# Patient Record
Sex: Female | Born: 1965 | Hispanic: Yes | Marital: Single | Smoking: Never smoker
Health system: Southern US, Community
[De-identification: ages and names within clinical notes are randomized; demographics above are authoritative.]

## PROBLEM LIST (undated history)

## (undated) DIAGNOSIS — G4700 Insomnia, unspecified: Secondary | ICD-10-CM

## (undated) DIAGNOSIS — K219 Gastro-esophageal reflux disease without esophagitis: Secondary | ICD-10-CM

## (undated) DIAGNOSIS — F32A Depression, unspecified: Secondary | ICD-10-CM

## (undated) DIAGNOSIS — F419 Anxiety disorder, unspecified: Secondary | ICD-10-CM

## (undated) DIAGNOSIS — J45909 Unspecified asthma, uncomplicated: Secondary | ICD-10-CM

## (undated) DIAGNOSIS — E785 Hyperlipidemia, unspecified: Secondary | ICD-10-CM

## (undated) DIAGNOSIS — G43909 Migraine, unspecified, not intractable, without status migrainosus: Secondary | ICD-10-CM

## (undated) HISTORY — PX: CYST REMOVAL HAND: SHX6279

---

## 2010-10-24 ENCOUNTER — Inpatient Hospital Stay: Payer: Self-pay | Admitting: Unknown Physician Specialty

## 2011-07-01 ENCOUNTER — Emergency Department: Payer: Self-pay | Admitting: Emergency Medicine

## 2011-09-30 ENCOUNTER — Emergency Department: Payer: Self-pay | Admitting: Emergency Medicine

## 2011-09-30 LAB — CBC WITH DIFFERENTIAL/PLATELET
Basophil #: 0.1 10*3/uL (ref 0.0–0.1)
Basophil %: 1 %
Eosinophil #: 0.1 10*3/uL (ref 0.0–0.7)
Eosinophil %: 1.5 %
HCT: 37.9 % (ref 35.0–47.0)
HGB: 13 g/dL (ref 12.0–16.0)
Lymphocyte #: 3.4 10*3/uL (ref 1.0–3.6)
Lymphocyte %: 54.7 %
MCH: 29 pg (ref 26.0–34.0)
MCHC: 34.4 g/dL (ref 32.0–36.0)
MCV: 84 fL (ref 80–100)
Monocyte #: 0.7 x10 3/mm (ref 0.2–0.9)
Monocyte %: 11.2 %
Neutrophil #: 2 10*3/uL (ref 1.4–6.5)
Neutrophil %: 31.6 %
Platelet: 169 10*3/uL (ref 150–440)
RBC: 4.5 10*6/uL (ref 3.80–5.20)
RDW: 13.9 % (ref 11.5–14.5)
WBC: 6.2 10*3/uL (ref 3.6–11.0)

## 2011-09-30 LAB — COMPREHENSIVE METABOLIC PANEL
Albumin: 3.3 g/dL — ABNORMAL LOW (ref 3.4–5.0)
Alkaline Phosphatase: 88 U/L (ref 50–136)
Anion Gap: 5 — ABNORMAL LOW (ref 7–16)
BUN: 13 mg/dL (ref 7–18)
Bilirubin,Total: 0.4 mg/dL (ref 0.2–1.0)
Calcium, Total: 8.3 mg/dL — ABNORMAL LOW (ref 8.5–10.1)
Chloride: 107 mmol/L (ref 98–107)
Co2: 28 mmol/L (ref 21–32)
Creatinine: 0.84 mg/dL (ref 0.60–1.30)
EGFR (African American): >60
EGFR (Non-African Amer.): >60
Glucose: 95 mg/dL (ref 65–99)
Osmolality: 279 (ref 275–301)
Potassium: 3.8 mmol/L (ref 3.5–5.1)
SGOT(AST): 55 U/L — ABNORMAL HIGH (ref 15–37)
SGPT (ALT): 74 U/L
Sodium: 140 mmol/L (ref 136–145)
Total Protein: 7.2 g/dL (ref 6.4–8.2)

## 2011-09-30 LAB — URINALYSIS, COMPLETE
RBC,UR: NONE SEEN /HPF (ref 0–5)
Squamous Epithelial: 2
WBC UR: 3 /HPF (ref 0–5)

## 2011-09-30 LAB — WET PREP, GENITAL

## 2012-03-17 IMAGING — CT CT HEAD WITHOUT CONTRAST
2 series · 16 of 30 positions shown, 20 images · non-contrast
Comparison: none

REASON FOR EXAM: AMS/OVERDOSE
COMMENTS:

[Series 2: without · axial · non-contrast · 0.40mm/px · z∈[+21,+141]mm · 13 of 30 slices shown, 17 images]
[im 3/30  brain]
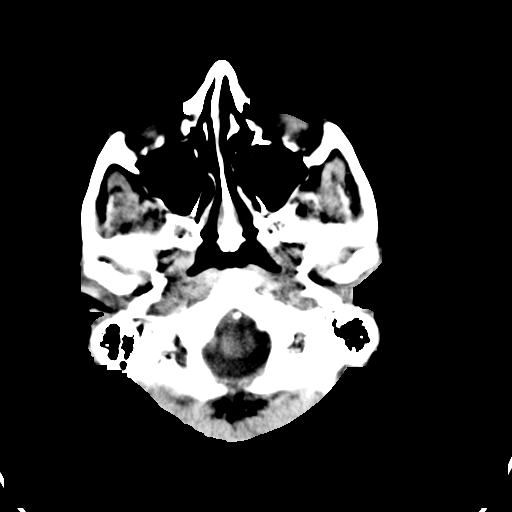
[im 3/30  bone]
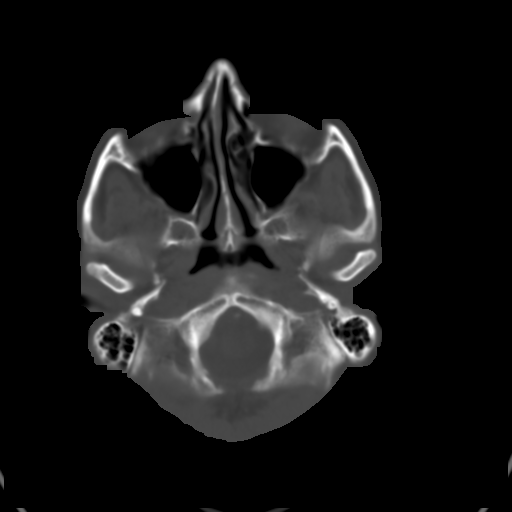
[im 5/30  brain]
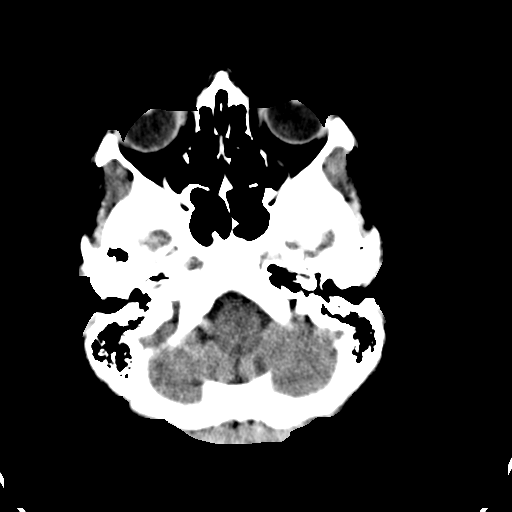
[im 7/30  brain]
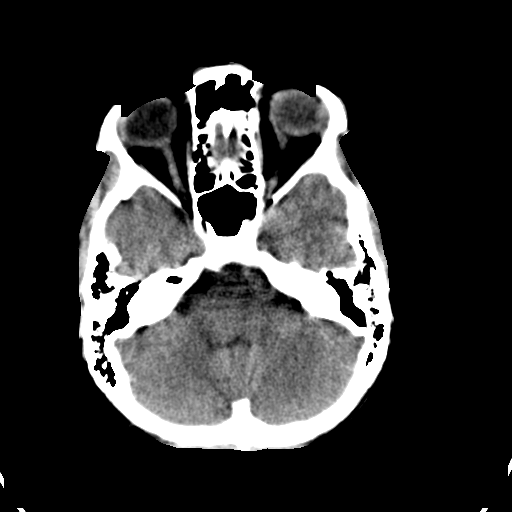
[im 9/30  brain]
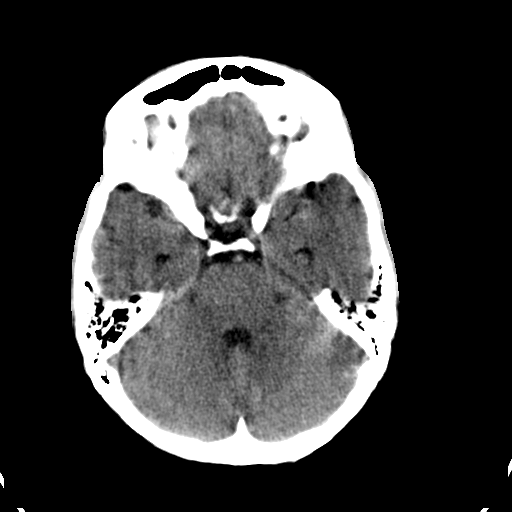
[im 11/30  brain]
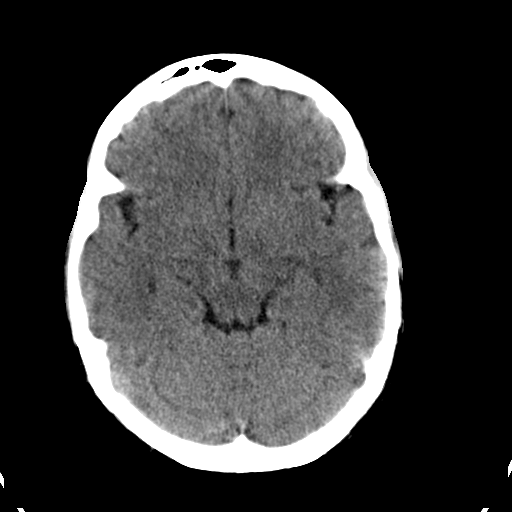
[im 11/30  bone]
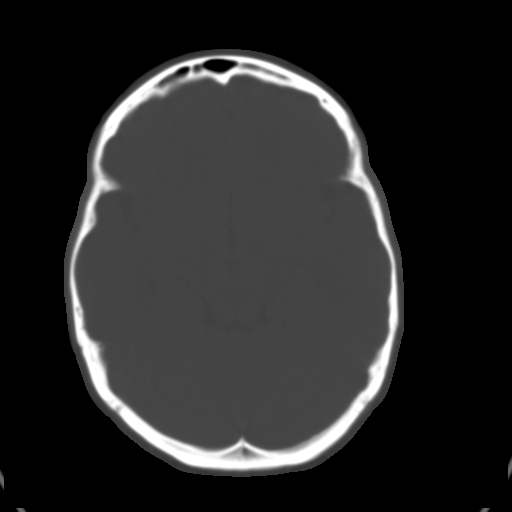
[im 13/30  brain]
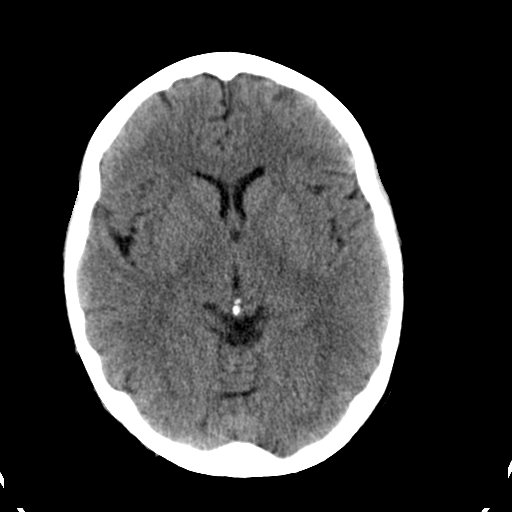
[im 15/30  brain]
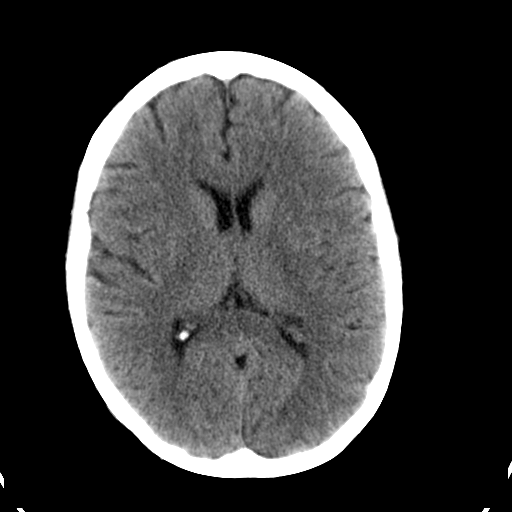
[im 17/30  brain]
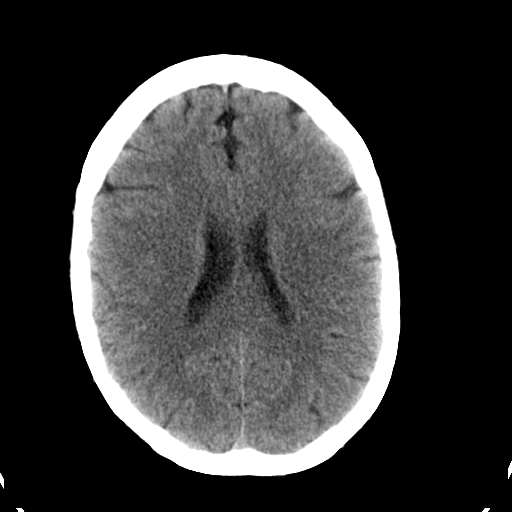
[im 19/30  brain]
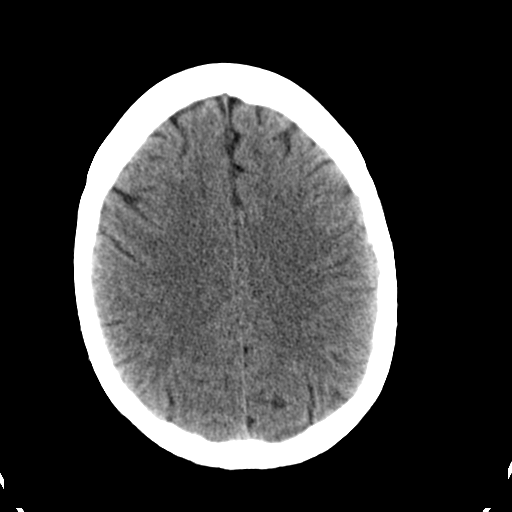
[im 19/30  bone]
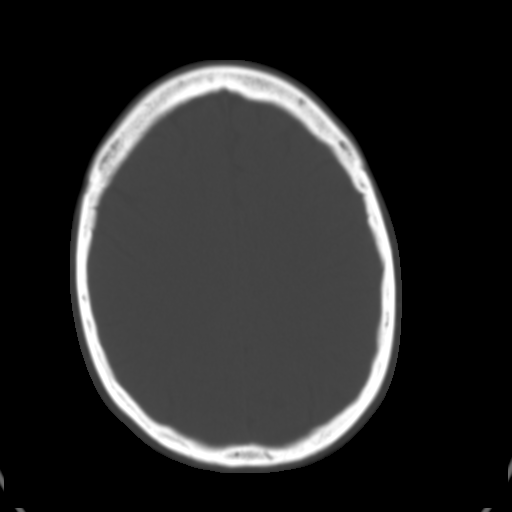
[im 21/30  brain]
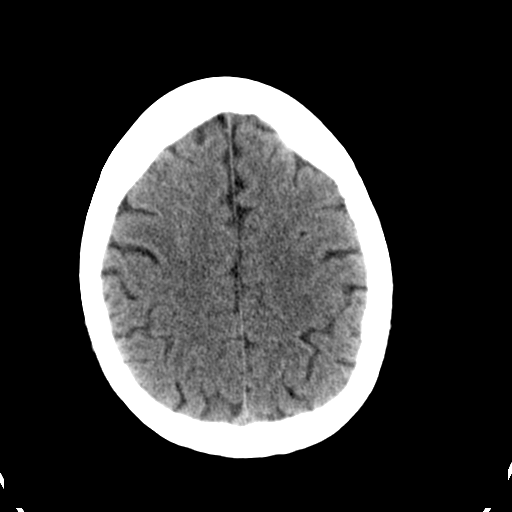
[im 23/30  brain]
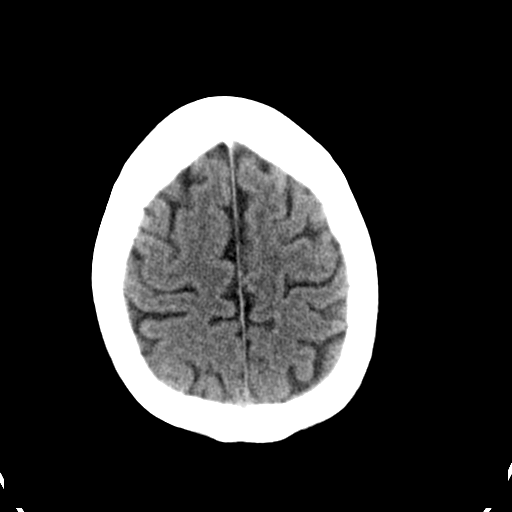
[im 25/30  brain]
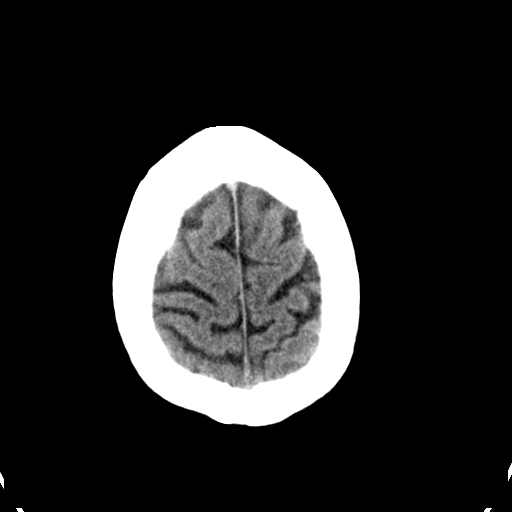
[im 27/30  brain]
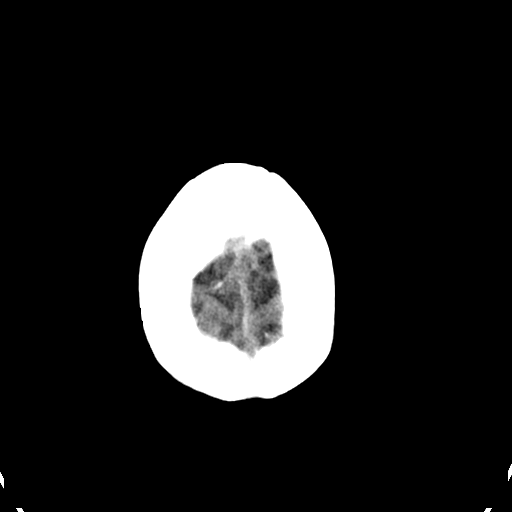
[im 27/30  bone]
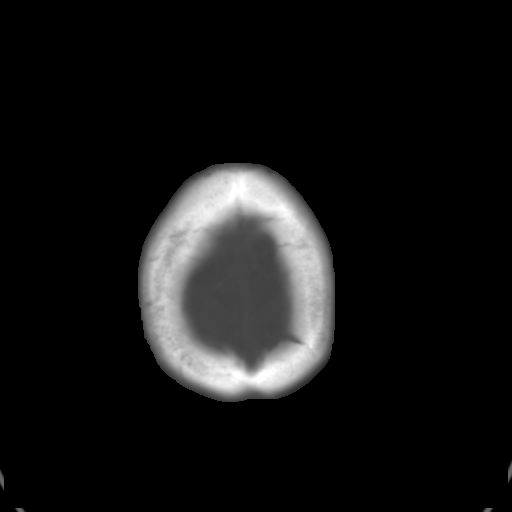

[Series 3: bone · axial · 0.40mm/px · z∈[+21,+61]mm · 3 of 30 slices shown]
[im 3/30  bone]
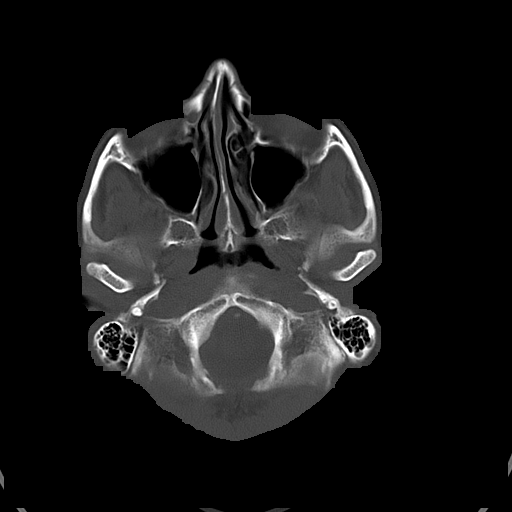
[im 7/30  bone]
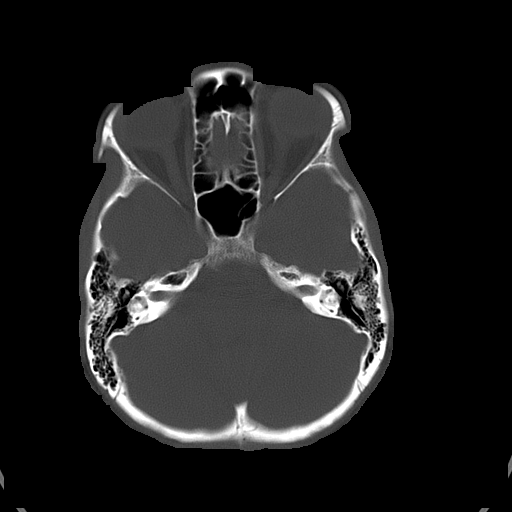
[im 11/30  bone]
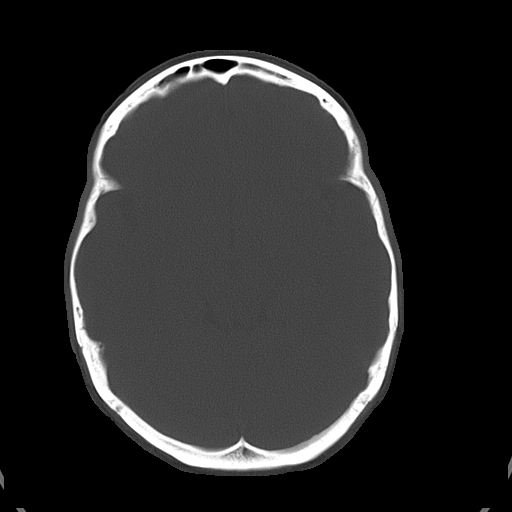

[16 of 30 positions shown; findings below may reference images not displayed]

PROCEDURE:     CT  - CT HEAD WITHOUT CONTRAST  - October 23, 2010 [DATE]

RESULT:     Emergent noncontrast CT of the brain is performed in the
standard fashion. There is no previous exam for comparison.

The ventricles and sulci are normal. There is no hemorrhage. There is no
focal mass, mass-effect or midline shift. There is no evidence of edema or
territorial infarct. The bone windows demonstrate normal aeration of the
paranasal sinuses and mastoid air cells. There is no skull fracture
demonstrated.
IMPRESSION: 1. No acute intracranial abnormality.

## 2012-12-04 ENCOUNTER — Ambulatory Visit: Payer: Self-pay | Admitting: Emergency Medicine

## 2012-12-04 LAB — CBC WITH DIFFERENTIAL/PLATELET
Basophil #: 0 10*3/uL (ref 0.0–0.1)
Basophil %: 0.5 %
Eosinophil #: 0 10*3/uL (ref 0.0–0.7)
Eosinophil %: 0.6 %
HCT: 38.7 % (ref 35.0–47.0)
HGB: 13 g/dL (ref 12.0–16.0)
Lymphocyte #: 1.9 10*3/uL (ref 1.0–3.6)
Lymphocyte %: 28 %
MCH: 27.8 pg (ref 26.0–34.0)
MCHC: 33.7 g/dL (ref 32.0–36.0)
MCV: 83 fL (ref 80–100)
Monocyte #: 0.6 x10 3/mm (ref 0.2–0.9)
Monocyte %: 8.2 %
Neutrophil #: 4.3 10*3/uL (ref 1.4–6.5)
Neutrophil %: 62.7 %
Platelet: 194 10*3/uL (ref 150–440)
RBC: 4.68 10*6/uL (ref 3.80–5.20)
RDW: 13.9 % (ref 11.5–14.5)
WBC: 6.8 10*3/uL (ref 3.6–11.0)

## 2012-12-04 LAB — COMPREHENSIVE METABOLIC PANEL
Albumin: 4.2 g/dL (ref 3.4–5.0)
Alkaline Phosphatase: 85 U/L (ref 50–136)
Anion Gap: 14 (ref 7–16)
BUN: 10 mg/dL (ref 7–18)
Bilirubin,Total: 0.6 mg/dL (ref 0.2–1.0)
Calcium, Total: 9.7 mg/dL (ref 8.5–10.1)
Chloride: 104 mmol/L (ref 98–107)
Co2: 24 mmol/L (ref 21–32)
Creatinine: 0.73 mg/dL (ref 0.60–1.30)
EGFR (African American): >60
EGFR (Non-African Amer.): >60
Glucose: 105 mg/dL — ABNORMAL HIGH (ref 65–99)
Osmolality: 283 (ref 275–301)
Potassium: 3.5 mmol/L (ref 3.5–5.1)
SGOT(AST): 16 U/L (ref 15–37)
SGPT (ALT): 16 U/L (ref 12–78)
Sodium: 142 mmol/L (ref 136–145)
Total Protein: 8.2 g/dL (ref 6.4–8.2)

## 2012-12-04 LAB — AMYLASE: Amylase: 61 U/L (ref 25–115)

## 2012-12-04 LAB — LIPASE, BLOOD: Lipase: 165 U/L (ref 73–393)

## 2012-12-05 ENCOUNTER — Ambulatory Visit: Payer: Self-pay | Admitting: Emergency Medicine

## 2014-04-21 ENCOUNTER — Emergency Department: Payer: Self-pay | Admitting: Emergency Medicine

## 2014-04-21 LAB — BASIC METABOLIC PANEL
Anion Gap: 8 (ref 7–16)
BUN: 8 mg/dL (ref 7–18)
Calcium, Total: 8.9 mg/dL (ref 8.5–10.1)
Chloride: 105 mmol/L (ref 98–107)
Co2: 28 mmol/L (ref 21–32)
Creatinine: 0.76 mg/dL (ref 0.60–1.30)
EGFR (African American): >60
EGFR (Non-African Amer.): >60
Glucose: 112 mg/dL — ABNORMAL HIGH (ref 65–99)
Osmolality: 280 (ref 275–301)
Potassium: 3.6 mmol/L (ref 3.5–5.1)
Sodium: 141 mmol/L (ref 136–145)

## 2014-04-21 LAB — CBC WITH DIFFERENTIAL/PLATELET
Basophil #: 0 10*3/uL (ref 0.0–0.1)
Basophil %: 0.5 %
Eosinophil #: 0.1 10*3/uL (ref 0.0–0.7)
Eosinophil %: 0.7 %
HCT: 39.3 % (ref 35.0–47.0)
HGB: 12.8 g/dL (ref 12.0–16.0)
Lymphocyte #: 1 10*3/uL (ref 1.0–3.6)
Lymphocyte %: 12.4 %
MCH: 27.5 pg (ref 26.0–34.0)
MCHC: 32.6 g/dL (ref 32.0–36.0)
MCV: 84 fL (ref 80–100)
Monocyte #: 0.3 x10 3/mm (ref 0.2–0.9)
Monocyte %: 3.3 %
Neutrophil #: 6.8 10*3/uL — ABNORMAL HIGH (ref 1.4–6.5)
Neutrophil %: 83.1 %
Platelet: 204 10*3/uL (ref 150–440)
RBC: 4.67 10*6/uL (ref 3.80–5.20)
RDW: 13.9 % (ref 11.5–14.5)
WBC: 8.1 10*3/uL (ref 3.6–11.0)

## 2016-04-25 DIAGNOSIS — J45909 Unspecified asthma, uncomplicated: Secondary | ICD-10-CM | POA: Diagnosis present

## 2019-05-23 ENCOUNTER — Encounter: Payer: Self-pay | Admitting: Emergency Medicine

## 2019-05-23 ENCOUNTER — Other Ambulatory Visit: Payer: Self-pay

## 2019-05-23 DIAGNOSIS — Z5321 Procedure and treatment not carried out due to patient leaving prior to being seen by health care provider: Secondary | ICD-10-CM | POA: Diagnosis not present

## 2019-05-23 DIAGNOSIS — R42 Dizziness and giddiness: Secondary | ICD-10-CM | POA: Diagnosis present

## 2019-05-23 DIAGNOSIS — R55 Syncope and collapse: Secondary | ICD-10-CM | POA: Diagnosis not present

## 2019-05-23 LAB — URINALYSIS, COMPLETE (UACMP) WITH MICROSCOPIC
Bacteria, UA: NONE SEEN
Bilirubin Urine: NEGATIVE
Glucose, UA: NEGATIVE mg/dL
Ketones, ur: NEGATIVE mg/dL
Nitrite: NEGATIVE
Protein, ur: NEGATIVE mg/dL
Specific Gravity, Urine: 1.02 (ref 1.005–1.030)
pH: 5 (ref 5.0–8.0)

## 2019-05-23 LAB — BASIC METABOLIC PANEL
Anion gap: 8 (ref 5–15)
BUN: 21 mg/dL — ABNORMAL HIGH (ref 6–20)
CO2: 26 mmol/L (ref 22–32)
Calcium: 9.3 mg/dL (ref 8.9–10.3)
Chloride: 105 mmol/L (ref 98–111)
Creatinine, Ser: 0.85 mg/dL (ref 0.44–1.00)
GFR calc Af Amer: >60 mL/min (ref >60)
GFR calc non Af Amer: >60 mL/min (ref >60)
Glucose, Bld: 100 mg/dL — ABNORMAL HIGH (ref 70–99)
Potassium: 3.9 mmol/L (ref 3.5–5.1)
Sodium: 139 mmol/L (ref 135–145)

## 2019-05-23 LAB — CBC
HCT: 38.3 % (ref 36.0–46.0)
Hemoglobin: 12.5 g/dL (ref 12.0–15.0)
MCH: 27.9 pg (ref 26.0–34.0)
MCHC: 32.6 g/dL (ref 30.0–36.0)
MCV: 85.5 fL (ref 80.0–100.0)
Platelets: 237 10*3/uL (ref 150–400)
RBC: 4.48 MIL/uL (ref 3.87–5.11)
RDW: 12.8 % (ref 11.5–15.5)
WBC: 7.3 10*3/uL (ref 4.0–10.5)
nRBC: 0 % (ref 0.0–0.2)

## 2019-05-23 LAB — TROPONIN I (HIGH SENSITIVITY): Troponin I (High Sensitivity): <2 ng/L (ref <18)

## 2019-05-23 LAB — POCT PREGNANCY, URINE: Preg Test, Ur: NEGATIVE

## 2019-05-23 MED ORDER — SODIUM CHLORIDE 0.9% FLUSH: 3.0000 mL | Freq: Once | INTRAVENOUS | Status: DC

## 2019-05-23 NOTE — ED Triage Notes (Signed)
C/O intermittent dizziness x 4 weeks.  Describes episodes of possible syncope at times.  Describes 3-4 day history of feeling brain is "lagging behind body". Seen by PCP for same complaints, has sleep study scheduled for tomorrow.  AAOx3.  Skin warm and dry. NAD  Speech clear.  MAE equally and strong.  Gait steady.

## 2019-05-24 ENCOUNTER — Emergency Department
Admission: EM | Admit: 2019-05-24 | Discharge: 2019-05-24 | Disposition: A | Discharge status: Home / Self Care | Payer: Managed Care, Other (non HMO) | Attending: Emergency Medicine | Admitting: Emergency Medicine

## 2019-05-24 HISTORY — DX: Insomnia, unspecified: G47.00

## 2019-05-24 HISTORY — DX: Migraine, unspecified, not intractable, without status migrainosus: G43.909

## 2019-05-24 NOTE — ED Notes (Signed)
Pt reports leaving now due to long wait 

## 2020-05-16 DIAGNOSIS — I2542 Coronary artery dissection: Secondary | ICD-10-CM

## 2020-05-16 HISTORY — DX: Coronary artery dissection: I25.42

## 2020-07-13 DIAGNOSIS — E785 Hyperlipidemia, unspecified: Secondary | ICD-10-CM | POA: Diagnosis present

## 2021-11-09 ENCOUNTER — Observation Stay
Admission: EM | Admit: 2021-11-09 | Discharge: 2021-11-11 | Discharge status: Against Medical Advice | Payer: Medicaid Other | Attending: Internal Medicine | Admitting: Internal Medicine

## 2021-11-09 ENCOUNTER — Emergency Department: Payer: Medicaid Other

## 2021-11-09 ENCOUNTER — Other Ambulatory Visit: Payer: Self-pay

## 2021-11-09 DIAGNOSIS — D649 Anemia, unspecified: Secondary | ICD-10-CM | POA: Diagnosis not present

## 2021-11-09 DIAGNOSIS — E785 Hyperlipidemia, unspecified: Secondary | ICD-10-CM | POA: Insufficient documentation

## 2021-11-09 DIAGNOSIS — I25110 Atherosclerotic heart disease of native coronary artery with unstable angina pectoris: Secondary | ICD-10-CM | POA: Insufficient documentation

## 2021-11-09 DIAGNOSIS — R0602 Shortness of breath: Secondary | ICD-10-CM | POA: Insufficient documentation

## 2021-11-09 DIAGNOSIS — R0789 Other chest pain: Principal | ICD-10-CM | POA: Insufficient documentation

## 2021-11-09 DIAGNOSIS — Z79899 Other long term (current) drug therapy: Secondary | ICD-10-CM | POA: Insufficient documentation

## 2021-11-09 DIAGNOSIS — J45909 Unspecified asthma, uncomplicated: Secondary | ICD-10-CM | POA: Diagnosis not present

## 2021-11-09 DIAGNOSIS — R079 Chest pain, unspecified: Secondary | ICD-10-CM | POA: Diagnosis present

## 2021-11-09 DIAGNOSIS — I200 Unstable angina: Secondary | ICD-10-CM | POA: Diagnosis not present

## 2021-11-09 HISTORY — DX: Anxiety disorder, unspecified: F41.9

## 2021-11-09 HISTORY — DX: Depression, unspecified: F32.A

## 2021-11-09 HISTORY — DX: Unspecified asthma, uncomplicated: J45.909

## 2021-11-09 HISTORY — DX: Gastro-esophageal reflux disease without esophagitis: K21.9

## 2021-11-09 HISTORY — DX: Hyperlipidemia, unspecified: E78.5

## 2021-11-09 LAB — APTT: aPTT: 31 seconds (ref 24–36)

## 2021-11-09 LAB — BASIC METABOLIC PANEL
Anion gap: 10 (ref 5–15)
BUN: 19 mg/dL (ref 6–20)
CO2: 21 mmol/L — ABNORMAL LOW (ref 22–32)
Calcium: 9 mg/dL (ref 8.9–10.3)
Chloride: 110 mmol/L (ref 98–111)
Creatinine, Ser: 0.76 mg/dL (ref 0.44–1.00)
GFR, Estimated: >60 mL/min (ref >60)
Glucose, Bld: 104 mg/dL — ABNORMAL HIGH (ref 70–99)
Potassium: 3.6 mmol/L (ref 3.5–5.1)
Sodium: 141 mmol/L (ref 135–145)

## 2021-11-09 LAB — CBC
HCT: 35.1 % — ABNORMAL LOW (ref 36.0–46.0)
Hemoglobin: 11.6 g/dL — ABNORMAL LOW (ref 12.0–15.0)
MCH: 28 pg (ref 26.0–34.0)
MCHC: 33 g/dL (ref 30.0–36.0)
MCV: 84.6 fL (ref 80.0–100.0)
Platelets: 185 10*3/uL (ref 150–400)
RBC: 4.15 MIL/uL (ref 3.87–5.11)
RDW: 13.2 % (ref 11.5–15.5)
WBC: 6.9 10*3/uL (ref 4.0–10.5)
nRBC: 0 % (ref 0.0–0.2)

## 2021-11-09 LAB — BRAIN NATRIURETIC PEPTIDE: B Natriuretic Peptide: 10.3 pg/mL (ref 0.0–100.0)

## 2021-11-09 LAB — PROTIME-INR
INR: 1 (ref 0.8–1.2)
Prothrombin Time: 13 seconds (ref 11.4–15.2)

## 2021-11-09 LAB — TROPONIN I (HIGH SENSITIVITY)
Troponin I (High Sensitivity): 4 ng/L (ref <18)
Troponin I (High Sensitivity): 4 ng/L (ref <18)

## 2021-11-09 LAB — LIPASE, BLOOD: Lipase: 45 U/L (ref 11–51)

## 2021-11-09 LAB — D-DIMER, QUANTITATIVE: D-Dimer, Quant: 0.54 ug/mL-FEU — ABNORMAL HIGH (ref 0.00–0.50)

## 2021-11-09 MED ORDER — ATORVASTATIN CALCIUM 20 MG PO TABS
40.0000 mg | ORAL_TABLET | Freq: Every day | ORAL | Status: DC
  Administered 2021-11-10: 40 mg via ORAL
  Filled 2021-11-09: qty 2

## 2021-11-09 MED ORDER — ACETAMINOPHEN 325 MG PO TABS: 650.0000 mg | ORAL_TABLET | ORAL | Status: DC | PRN

## 2021-11-09 MED ORDER — HEPARIN BOLUS VIA INFUSION
2800.0000 [IU] | Freq: Once | INTRAVENOUS | Status: AC
  Administered 2021-11-09: 2800 [IU] via INTRAVENOUS
  Filled 2021-11-09: qty 2800

## 2021-11-09 MED ORDER — NITROGLYCERIN 0.4 MG SL SUBL: 0.4000 mg | SUBLINGUAL_TABLET | SUBLINGUAL | Status: DC | PRN

## 2021-11-09 MED ORDER — IOHEXOL 350 MG/ML SOLN
75.0000 mL | Freq: Once | INTRAVENOUS | Status: AC | PRN
  Administered 2021-11-09: 75 mL via INTRAVENOUS

## 2021-11-09 MED ORDER — SODIUM CHLORIDE 0.9% FLUSH
3.0000 mL | Freq: Two times a day (BID) | INTRAVENOUS | Status: DC
  Administered 2021-11-10: 3 mL via INTRAVENOUS

## 2021-11-09 MED ORDER — HEPARIN (PORCINE) 25000 UT/250ML-% IV SOLN
600.0000 [IU]/h | INTRAVENOUS | Status: DC
  Administered 2021-11-09: 600 [IU]/h via INTRAVENOUS
  Filled 2021-11-09: qty 250

## 2021-11-09 MED ORDER — ASPIRIN 81 MG PO TBEC
81.0000 mg | DELAYED_RELEASE_TABLET | Freq: Every day | ORAL | Status: DC
  Administered 2021-11-10: 81 mg via ORAL
  Filled 2021-11-09: qty 1

## 2021-11-09 MED ORDER — SODIUM CHLORIDE 0.9% FLUSH: 3.0000 mL | INTRAVENOUS | Status: DC | PRN

## 2021-11-09 MED ORDER — ACETAMINOPHEN 500 MG PO TABS
1000.0000 mg | ORAL_TABLET | Freq: Once | ORAL | Status: AC
  Administered 2021-11-09: 1000 mg via ORAL
  Filled 2021-11-09: qty 2

## 2021-11-09 MED ORDER — ONDANSETRON HCL 4 MG/2ML IJ SOLN: 4.0000 mg | Freq: Four times a day (QID) | INTRAMUSCULAR | Status: DC | PRN

## 2021-11-09 MED ORDER — SODIUM CHLORIDE 0.9 % IV SOLN: 250.0000 mL | INTRAVENOUS | Status: DC | PRN

## 2021-11-09 MED ORDER — SODIUM CHLORIDE 0.9 % IV SOLN: INTRAVENOUS | Status: AC

## 2021-11-09 NOTE — ED Triage Notes (Signed)
Chest pain x 2 days. Pt reports pain became worse, sharp, stabbing in nature. Mid clavicular radiating into right back. Pain is better with rest. NS, 324 ASA, 1 spray Nitro with improvement of pain. Hx of MI last year

## 2021-11-09 NOTE — Progress Notes (Signed)
ANTICOAGULATION CONSULT NOTE   Pharmacy Consult for heparin infusion Indication: ACS/STEMI  Allergies  Allergen Reactions   Cafergot [Ergotamine-Caffeine]     Patient Measurements: Height: 5' (152.4 cm) Weight: 47.6 kg (105 lb) IBW/kg (Calculated) : 45.5 Heparin Dosing Weight: 47.6 kg  Vital Signs: Temp: 98.2 F (36.8 C) (06/27 2025) Temp Source: Oral (06/27 2025) BP: 122/61 (06/27 2027) Pulse Rate: 70 (06/27 2025)  Labs: Recent Labs    11/09/21 2028  HGB 11.6*  HCT 35.1*  PLT 185  CREATININE 0.76  TROPONINIHS 4    Estimated Creatinine Clearance: 55.1 mL/min (by C-G formula based on SCr of 0.76 mg/dL).   Medical History: Past Medical History:  Diagnosis Date   Insomnia    Migraine    Assessment: Pt is 56 yo female w/ hx of NSTEMI 2022, presenting to ED d/t worsening CP & SOB over past 2 days.  Goal of Therapy:  Heparin level 0.3-0.7 units/ml Monitor platelets by anticoagulation protocol: Yes   Plan:  Bolus 2800 units x 1 Start heparin infusion at 600 units/hr Check HL in 6 hr after start of infusion CBC daily while on heparin  Otelia Sergeant, PharmD, [facility] 11/09/2021 11:08 PM

## 2021-11-09 NOTE — H&P (Signed)
History and Physical    Patient: Nicole Simpson YKZ:[id number] DOB: 07-Aug-1965 DOA: 11/09/2021 DOS: the patient was seen and examined on 11/10/2021 PCP: Rondel Oh, NP  Patient coming from: Home  Chief Complaint:  Chief Complaint  Patient presents with   Chest Pain   HPI: Nicole Simpson is a 56 y.o. female with medical history significant of CAD, chest pain and sob and worse today while using her shopping cart at [employer].  Patient describes severe shortness of breath and fatigue and lack of energy and not being able to keep up. Patient otherwise does not report any headaches blurred vision speech or gait issues abdominal pain fevers chills nausea vomiting diarrhea any bladder or bowel complaints otherwise.  Review of Systems: As mentioned in the history of present illness. All other systems reviewed and are negative. Past Medical History:  Diagnosis Date   Insomnia    Migraine    Past Surgical History:  Procedure Laterality Date   CESAREAN SECTION     Social History:  reports that she has never smoked. She has never used smokeless tobacco. She reports that she does not currently use alcohol. She reports that she does not use drugs.  Allergies  Allergen Reactions   Cafergot [Ergotamine-Caffeine]     History reviewed. No pertinent family history.  Prior to Admission medications   Not on File    Physical Exam: Vitals:   11/09/21 2025 11/09/21 2027 11/09/21 2300 11/10/21 0359  BP:  122/61 127/81 130/64  Pulse: 70  68 70  Resp: 12  13 16   Temp: 98.2 F (36.8 C)     TempSrc: Oral     SpO2: 100%  100% 99%  Weight:      Height:       Physical Exam Vitals and nursing note reviewed.  Constitutional:      General: She is not in acute distress.    Appearance: Normal appearance. She is not ill-appearing, toxic-appearing or diaphoretic.  HENT:     Head: Normocephalic and atraumatic.     Right Ear: Hearing and external ear normal.     Left Ear: Hearing  and external ear normal.     Nose: Nose normal. No nasal deformity.     Mouth/Throat:     Lips: Pink.     Mouth: Mucous membranes are moist.     Tongue: No lesions.     Pharynx: Oropharynx is clear.  Eyes:     Extraocular Movements: Extraocular movements intact.     Pupils: Pupils are equal, round, and reactive to light.  Cardiovascular:     Rate and Rhythm: Normal rate and regular rhythm.     Pulses: Normal pulses.     Heart sounds: Normal heart sounds.  Pulmonary:     Effort: Pulmonary effort is normal.     Breath sounds: Examination of the right-middle field reveals rales. Examination of the left-middle field reveals rales. Examination of the right-lower field reveals rales. Examination of the left-lower field reveals rales. Rales present.  Abdominal:     General: Bowel sounds are normal. There is no distension.     Palpations: Abdomen is soft. There is no mass.     Tenderness: There is no abdominal tenderness. There is no guarding.     Hernia: No hernia is present.  Musculoskeletal:     Right lower leg: 2+ Edema present.     Left lower leg: 2+ Edema present.  Skin:    General: Skin is  warm.  Neurological:     General: No focal deficit present.     Mental Status: She is alert and oriented to person, place, and time.     Cranial Nerves: Cranial nerves 2-12 are intact.     Motor: Motor function is intact.  Psychiatric:        Attention and Perception: Attention normal.        Mood and Affect: Mood normal.        Speech: Speech normal.        Behavior: Behavior normal. Behavior is cooperative.        Cognition and Memory: Cognition normal.     Data Reviewed: Results for orders placed or performed during the hospital encounter of 11/09/21 (from the past 24 hour(s))  Basic metabolic panel     Status: Abnormal   Collection Time: 11/09/21  8:28 PM  Result Value Ref Range   Sodium 141 135 - 145 mmol/L   Potassium 3.6 3.5 - 5.1 mmol/L   Chloride 110 98 - 111 mmol/L   CO2 21  (L) 22 - 32 mmol/L   Glucose, Bld 104 (H) 70 - 99 mg/dL   BUN 19 6 - 20 mg/dL   Creatinine, Ser 8.31 0.44 - 1.00 mg/dL   Calcium 9.0 8.9 - 51.7 mg/dL   GFR, Estimated >61 >60 mL/min   Anion gap 10 5 - 15  CBC     Status: Abnormal   Collection Time: 11/09/21  8:28 PM  Result Value Ref Range   WBC 6.9 4.0 - 10.5 K/uL   RBC 4.15 3.87 - 5.11 MIL/uL   Hemoglobin 11.6 (L) 12.0 - 15.0 g/dL   HCT 73.7 (L) 10.6 - 26.9 %   MCV 84.6 80.0 - 100.0 fL   MCH 28.0 26.0 - 34.0 pg   MCHC 33.0 30.0 - 36.0 g/dL   RDW 48.5 46.2 - 70.3 %   Platelets 185 150 - 400 K/uL   nRBC 0.0 0.0 - 0.2 %  Troponin I (High Sensitivity)     Status: None   Collection Time: 11/09/21  8:28 PM  Result Value Ref Range   Troponin I (High Sensitivity) 4 <18 ng/L  D-dimer, quantitative     Status: Abnormal   Collection Time: 11/09/21  8:28 PM  Result Value Ref Range   D-Dimer, Quant 0.54 (H) 0.00 - 0.50 ug/mL-FEU  Lipase, blood     Status: None   Collection Time: 11/09/21  8:28 PM  Result Value Ref Range   Lipase 45 11 - 51 U/L    >EKG today shows:  Sinus rhythm at 72 and LAE>diffuse st depression and lvh.  In ed pt started on heparin drip.       Assessment and Plan: * Chest pain Pt presenting with chest pain , negative troponin. ekg shows SR 72, LAE, LVH,St depression v4-v6. Pt continued on heparin gtt/ asa /statin therapy.  PRN NTG. ekg image:    Unstable angina [facility]) As above cardiology consult dr. Darrold Junker - am message.   SOB (shortness of breath) Suspect right sided heart failure.  We will get 2 d echo and CTA chest neg for pe: 1. Borderline cardiomegaly with mildly prominent central pulmonary veins. No edema or other acute chest findings. 2. No evidence of arterial dilatation, arterial embolic filling defects, or right heart strain findings. 3. There is a 1 cm cyst in the dome of the right hepatic anterior segment and a 1.3 cm indeterminate hypodensity in the posterior segment. Further  evaluation recommended. 4. Additional scattered tiny too small to characterize hypodensities elsewhere in the liver.    Anemia We will follow. No symptoms of any bleeding.    Hyperlipidemia Continue atorvastatin and lipid panel in am. Tft.    Asthma Stable. Prn Albuterol.        Advance Care Planning:    Code Status: Full Code   Consults:  Cardiology; Dr. Darrold Junker.   Family Communication:  [email or web address] (Friend)  [phone] (Mobile  Severity of Illness: The appropriate patient status for this patient is OBSERVATION. Observation status is judged to be reasonable and necessary in order to provide the required intensity of service to ensure the patient's safety. The patient's presenting symptoms, physical exam findings, and initial radiographic and laboratory data in the context of their medical condition is felt to place them at decreased risk for further clinical deterioration. Furthermore, it is anticipated that the patient will be medically stable for discharge from the hospital within 2 midnights of admission.   Author: Gertha Calkin, MD 11/10/2021 4:34 AM  For on call review [email or web address].

## 2021-11-10 ENCOUNTER — Encounter: Payer: Self-pay | Admitting: Internal Medicine

## 2021-11-10 ENCOUNTER — Observation Stay
Admit: 2021-11-10 | Discharge: 2021-11-10 | Disposition: A | Discharge status: Home / Self Care | Payer: Medicaid Other | Attending: Internal Medicine | Admitting: Internal Medicine

## 2021-11-10 DIAGNOSIS — E7849 Other hyperlipidemia: Secondary | ICD-10-CM | POA: Diagnosis not present

## 2021-11-10 DIAGNOSIS — D649 Anemia, unspecified: Secondary | ICD-10-CM | POA: Diagnosis not present

## 2021-11-10 DIAGNOSIS — E7800 Pure hypercholesterolemia, unspecified: Secondary | ICD-10-CM | POA: Diagnosis not present

## 2021-11-10 DIAGNOSIS — R079 Chest pain, unspecified: Secondary | ICD-10-CM | POA: Diagnosis not present

## 2021-11-10 LAB — TROPONIN I (HIGH SENSITIVITY)
Troponin I (High Sensitivity): 3 ng/L (ref <18)
Troponin I (High Sensitivity): 3 ng/L (ref <18)

## 2021-11-10 LAB — ECHOCARDIOGRAM COMPLETE
AR max vel: 1.77 cm2
AV Area VTI: 1.68 cm2
AV Area mean vel: 1.82 cm2
AV Mean grad: 4 mmHg
AV Peak grad: 7.1 mmHg
Ao pk vel: 1.33 m/s
Area-P 1/2: 3.63 cm2
Height: 60 in
P 1/2 time: 1360 msec
S' Lateral: 2.9 cm
Weight: 1680 oz

## 2021-11-10 LAB — CBC
HCT: 35.2 % — ABNORMAL LOW (ref 36.0–46.0)
Hemoglobin: 11.5 g/dL — ABNORMAL LOW (ref 12.0–15.0)
MCH: 27.9 pg (ref 26.0–34.0)
MCHC: 32.7 g/dL (ref 30.0–36.0)
MCV: 85.4 fL (ref 80.0–100.0)
Platelets: 176 10*3/uL (ref 150–400)
RBC: 4.12 MIL/uL (ref 3.87–5.11)
RDW: 13.2 % (ref 11.5–15.5)
WBC: 5.5 10*3/uL (ref 4.0–10.5)
nRBC: 0 % (ref 0.0–0.2)

## 2021-11-10 LAB — LIPID PANEL
Cholesterol: 214 mg/dL — ABNORMAL HIGH (ref 0–200)
HDL: 88 mg/dL (ref >40)
LDL Cholesterol: 115 mg/dL — ABNORMAL HIGH (ref 0–99)
Total CHOL/HDL Ratio: 2.4 RATIO
Triglycerides: 53 mg/dL (ref <150)
VLDL: 11 mg/dL (ref 0–40)

## 2021-11-10 LAB — TSH: TSH: 2.265 u[IU]/mL (ref 0.350–4.500)

## 2021-11-10 LAB — HIV ANTIBODY (ROUTINE TESTING W REFLEX): HIV Screen 4th Generation wRfx: NONREACTIVE

## 2021-11-10 LAB — HEPARIN LEVEL (UNFRACTIONATED): Heparin Unfractionated: 0.47 IU/mL (ref 0.30–0.70)

## 2021-11-10 LAB — T4, FREE: Free T4: 1.02 ng/dL (ref 0.61–1.12)

## 2021-11-10 MED ORDER — ISOSORBIDE MONONITRATE ER 30 MG PO TB24
15.0000 mg | ORAL_TABLET | Freq: Every day | ORAL | Status: DC
  Filled 2021-11-10: qty 1

## 2021-11-10 MED ORDER — RANOLAZINE ER 500 MG PO TB12
500.0000 mg | ORAL_TABLET | Freq: Two times a day (BID) | ORAL | Status: DC
  Administered 2021-11-10: 500 mg via ORAL
  Filled 2021-11-10: qty 1

## 2021-11-10 NOTE — Assessment & Plan Note (Signed)
As above cardiology consult dr. Darrold Junker - am message.

## 2021-11-10 NOTE — Assessment & Plan Note (Addendum)
Pt presenting with chest pain , negative troponin. ekg shows SR 72, LAE, LVH,St depression v4-v6. Pt continued on heparin gtt/ asa /statin therapy.  PRN NTG. ekg image:

## 2021-11-10 NOTE — Assessment & Plan Note (Signed)
Continue atorvastatin and lipid panel in am. Tft.

## 2021-11-10 NOTE — Progress Notes (Signed)
Admission profile updated. ?

## 2021-11-10 NOTE — Consult Note (Signed)
Cardiology Consultation:   Patient ID: Nicole Simpson MRN: [id number]; DOB: [birth year]  Admit date: 11/09/2021 Date of Consult: 11/10/2021  PCP:  Rondel Oh, NP   [facility] Providers Cardiologist:  Debbe Odea, MD        Patient Profile:   Nicole Simpson is a 56 y.o. female with a hx of hyperlipidemia, GERD, depression, asthma, anxiety, migraines, NSTEMI in 2022, and sleep apnea who is being seen 11/10/2021 for the evaluation of chest pain at the request of Dr. Mayford Knife.  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  Previously patient has been followed by Dr. Juliann Pares at [facility] but during this visit she has requested to change providers to [facility] provider.  Her cardiac history started 07/06/2020 where she was admitted to the hospital for worsening chest pain and shortness of breath.  She woke up from sleep with stabbing 10 out of 10 left chest pain rating into the left jaw and left arm associated shortness of breath, palpitations and diaphoresis she underwent left heart catheterization which revealed normal left main, left circumflex, and RCA.  Distal LAD had a short segment with 99% stenosis concern initially for coronary vasospasm with TIMI II flow.  IV nitro nicardipine were given without improvement in stenosis (remains subtotal with now TIMI I flow).  She was started on aspirin, statin, and CCB, as needed sublingual nitro.  She was discharged home with medical management. On yesterday she stated that typically when she does her grocery shopping she rides a motorized scooter.  Yesterday she opted to push a grocery cart and was only able to walk about 30 feet before  having to stop with the shortness of breath.  After several episodes of continuing on with ambulation of 30 feet she started to have a sharp stabbing chest discomfort that was substernal, without radiation, that lasted for few seconds that the patient rated 10 /10 on the pain scale and subsided slightly to about an 8/10.  At that point time she was able to take only a few steps with her cart before she continued to have increasing discomfort and increasing shortness of breath.  She went to the check out by the time she got to the car she had her friend called 911.  When EMS arrived she had Nitropaste placed and had taken 4 baby aspirin's.  She continues to endorse chest discomfort that is rated 3/4 out of 10 that she states is now bearable and continued shortness of breath and dyspnea on exertion.  Prior to this episode she had had chronic abdominal pain associated with intermittent bloody diarrhea and thought that she had a GI virus but had been and able to follow-up with her GI provider prior to coming to the emergency department.  Initial vitals: Blood pressure 122/61, pulse 70, respirations 12, temp 98.2  Pertinent labs: CO2 21, glucose 104, hemoglobin 11.6, hematocrit 35.1, D-dimer 0.54, high-sensitivity troponins 4, 4, 3, 3, total cholesterol 214, LDL 115, BNP 10.3  Medications: Heparin bolus and heparin drip per protocol, and Tylenol at 1000 mg  Imaging: Chest x-ray revealed no active cardiopulmonary disease CTA of the chest borderline cardiomegaly with mildly prominent central pulmonary veins no edema or other acute  chest findings, no evidence of arterial dilatation, arterial emboli on filling defects, or right heart strain findings, there is a 1 mm cyst at the dome of the right hepatic anterior segment and a 1.3 cm indeterminate hypodensity in the posterior segment further evaluation is recommended, additional scattered tiny too small to characterize hypodensities elsewhere in the liver   Past  Medical History:  Diagnosis Date   Anxiety    Asthma    Depression    GERD (gastroesophageal reflux disease)    Hyperlipidemia    Insomnia    Migraine    Spontaneous dissection of coronary artery 2022    Past Surgical History:  Procedure Laterality Date   CESAREAN SECTION       Home Medications:  Prior to Admission medications   Medication Sig Start Date End Date Taking? Authorizing Provider  ASMANEX, 120 METERED DOSES, 220 MCG/ACT inhaler Inhale 2 puffs into the lungs 2 (two) times daily. 11/03/21   [provider]  clonazePAM (KLONOPIN) 1 MG tablet Take 1 mg by mouth at bedtime as needed for anxiety. 09/07/21   [provider]  gabapentin (NEURONTIN) 100 MG capsule Take 100 mg by mouth at bedtime. 09/07/21 09/07/22  [provider]  Galcanezumab-gnlm (EMGALITY) 120 MG/ML SOAJ Inject into the skin. Inject 240 mg into the skin monthly. LOADING DOSE-FIRST MONTH ONLY 10/12/21   [provider]  nitroGLYCERIN (NITROSTAT) 0.4 MG SL tablet Place under the tongue. Place 1 tablet (0.4 mg total) under the tongue as directed for Chest pain May take up to 3 doses. 08/27/21 08/27/22  [provider]  SPIRIVA HANDIHALER 18 MCG inhalation capsule Place 1 capsule into inhaler and inhale daily. 10/21/21   [provider]  Ubrogepant 100 MG TABS Take by mouth. Take 100 mg by mouth as directed (Take 1 tab at migraine onset, ok to repeat after 2 hours. Maximum 200mg  per 24 hr period.) 10/12/21 10/12/22  [provider]    Inpatient Medications: Scheduled Meds:  aspirin EC  81 mg Oral Daily   atorvastatin  40 mg Oral q1800   sodium chloride flush  3 mL Intravenous Q12H   Continuous Infusions:  sodium chloride     sodium chloride     heparin 600 Units/hr (11/09/21 2321)   PRN Meds: sodium chloride, acetaminophen, nitroGLYCERIN, ondansetron (ZOFRAN) IV, sodium chloride flush  Allergies:    Allergies  Allergen Reactions   Cafergot  [Ergotamine-Caffeine]     Social History:   Social History   Socioeconomic History   Marital status: Single    Spouse name: Not on file   Number of children: Not on file   Years of education: Not on file   Highest education level: Not on file  Occupational History   Not on file  Tobacco Use   Smoking status: Never   Smokeless tobacco: Never  Substance and Sexual Activity   Alcohol use: Not Currently   Drug use: Never   Sexual activity: Not on file  Other Topics Concern   Not on file  Social History Narrative   Not on file   Social Determinants of Health   Financial Resource Strain: Not on file  Food Insecurity: Not on file  Transportation Needs: Not on file  Physical Activity: Not on file  Stress: Not on file  Social Connections: Not on file  Intimate Partner Violence: Not on file    Family History:    Family History  Problem Relation Age of Onset   Coronary artery  disease Mother    Coronary artery disease Maternal Grandmother      ROS:  Please see the history of present illness.  Review of Systems  Constitutional:  Positive for malaise/fatigue.  HENT: Negative.    Eyes: Negative.   Respiratory:  Positive for shortness of breath.   Cardiovascular:  Positive for chest pain.  Gastrointestinal:  Positive for abdominal pain and diarrhea.  Genitourinary: Negative.   Musculoskeletal:  Positive for falls.  Skin: Negative.   Neurological:  Positive for weakness and headaches.  Endo/Heme/Allergies: Negative.   Psychiatric/Behavioral: Negative.      All other ROS reviewed and negative.     Physical Exam/Data:   Vitals:   11/10/21 0747 11/10/21 0800 11/10/21 1114 11/10/21 1414  BP: 112/75 109/71 106/66 (!) 109/58  Pulse: 72 73 72 66  Resp: Temp: 97.8 F (36.6 C)     TempSrc: Oral     SpO2: 100% 100% 100% 100%  Weight:      Height:       No intake or output data in the 24 hours ending 11/10/21 1448    11/09/2021    8:24 PM 05/23/2019     6:20 PM  Last 3 Weights  Weight (lbs) 105 lb 110 lb  Weight (kg) 47.628 kg 49.896 kg     Body mass index is 20.51 kg/m.  General:  Well nourished, well developed, in no acute distress HEENT: normal Neck: no JVD Vascular: No carotid bruits; Distal pulses 2+ bilaterally Cardiac:  normal S1, S2; RRR; no murmur  Lungs:  clear to auscultation bilaterally, no wheezing, rhonchi or rales  Abd: soft, nontender, no hepatomegaly  Ext: no edema Musculoskeletal:  No deformities, BUE and BLE strength normal and equal Skin: warm and dry  Neuro:  CNs 2-12 intact, no focal abnormalities noted Psych:  Normal affect   EKG:  The EKG was personally reviewed and demonstrates: Sinus rhythm rate of 73, RSR prime in V1 and V2, LVH with early repolarization Telemetry:  Telemetry was personally reviewed and demonstrates: Sinus rhythm rate of 60 and LVH  Relevant CV Studies: Echocardiogram completed 11/10/2021 1. Left ventricular ejection fraction, by estimation, is 55 to 60%. The left ventricle has normal function. The left ventricle has no regional wall motion abnormalities. Left ventricular diastolic parameters were normal.   2. Right ventricular systolic function is normal. The right ventricular size is normal.   3. The mitral valve is normal in structure. Mild mitral valve regurgitation. No evidence of mitral stenosis.   4. The aortic valve is normal in structure. Aortic valve regurgitation is not visualized. No aortic stenosis is present.   5. The inferior vena cava is normal in size with greater than 50% respiratory variability, suggesting right atrial pressure of 3 mmHg.   Laboratory Data:  High Sensitivity Troponin:   Recent Labs  Lab 11/09/21 2028 11/09/21 2226 11/10/21 0222 11/10/21 0629  TROPONINIHS Chemistry Recent Labs  Lab 11/09/21 2028  NA 141  K 3.6  CL 110  CO2 21*  GLUCOSE 104*  BUN 19  CREATININE 0.76  CALCIUM 9.0  GFRNONAA >60  ANIONGAP 10    No results  for input(s): "PROT", "ALBUMIN", "AST", "ALT", "ALKPHOS", "BILITOT" in the last 168 hours. Lipids  Recent Labs  Lab 11/10/21 0629  CHOL 214*  TRIG 53  HDL 88  LDLCALC 115*  CHOLHDL 2.4    Hematology Recent Labs  Lab 11/09/21 2028  11/10/21 0629  WBC 6.9 5.5  RBC 4.15 4.12  HGB 11.6* 11.5*  HCT 35.1* 35.2*  MCV 84.6 85.4  MCH 28.0 27.9  MCHC 33.0 32.7  RDW 13.2 13.2  PLT 185 176   Thyroid  Recent Labs  Lab 11/09/21 2226  TSH 2.265  FREET4 1.02    BNP Recent Labs  Lab 11/09/21 2028  BNP 10.3    DDimer  Recent Labs  Lab 11/09/21 2028  DDIMER 0.54*     Radiology/Studies:  ECHOCARDIOGRAM COMPLETE  Result Date: 11/10/2021    ECHOCARDIOGRAM REPORT   Patient Name:   Nicole Simpson [facility] Date of Exam: 11/10/2021 Medical Rec #:  [id number]             Height:       60.0 in Accession #:    [id number]            Weight:       105.0 lb Date of Birth:  July 11, 1965             BSA:          1.419 m Patient Age:    56 years              BP:           109/71 mmHg Patient Gender: F                     HR:           62 bpm. Exam Location:  [facility] Procedure: 2D Echo, Color Doppler and Cardiac Doppler Indications:     R07.9 Chest Pain  History:         Patient has no prior history of Echocardiogram examinations.                  CAD; Signs/Symptoms:Chest Pain and Shortness of Breath.  Sonographer:     Humphrey Rolls Referring Phys:  [id number] Eliezer Mccoy PATEL Diagnosing Phys: Marcina Millard MD IMPRESSIONS  1. Left ventricular ejection fraction, by estimation, is 55 to 60%. The left ventricle has normal function. The left ventricle has no regional wall motion abnormalities. Left ventricular diastolic parameters were normal.  2. Right ventricular systolic function is normal. The right ventricular size is normal.  3. The mitral valve is normal in structure. Mild mitral valve regurgitation. No evidence of mitral stenosis.  4. The aortic valve is normal in structure. Aortic valve regurgitation is not  visualized. No aortic stenosis is present.  5. The inferior vena cava is normal in size with greater than 50% respiratory variability, suggesting right atrial pressure of 3 mmHg. FINDINGS  Left Ventricle: Left ventricular ejection fraction, by estimation, is 55 to 60%. The left ventricle has normal function. The left ventricle has no regional wall motion abnormalities. The left ventricular internal cavity size was normal in size. There is  no left ventricular hypertrophy. Left ventricular diastolic parameters were normal. Right Ventricle: The right ventricular size is normal. No increase in right ventricular wall thickness. Right ventricular systolic function is normal. Left Atrium: Left atrial size was normal in size. Right Atrium: Right atrial size was normal in size. Pericardium: There is no evidence of pericardial effusion. Mitral Valve: The mitral valve is normal in structure. Mild mitral valve regurgitation. No evidence of mitral valve stenosis. Tricuspid Valve: The tricuspid valve is normal in structure. Tricuspid valve regurgitation is mild . No evidence of tricuspid stenosis. Aortic Valve: The aortic valve is normal in  structure. Aortic valve regurgitation is not visualized. Aortic regurgitation PHT measures 1360 msec. No aortic stenosis is present. Aortic valve mean gradient measures 4.0 mmHg. Aortic valve peak gradient measures 7.1 mmHg. Aortic valve area, by VTI measures 1.68 cm. Pulmonic Valve: The pulmonic valve was normal in structure. Pulmonic valve regurgitation is not visualized. No evidence of pulmonic stenosis. Aorta: The aortic root is normal in size and structure. Venous: The inferior vena cava is normal in size with greater than 50% respiratory variability, suggesting right atrial pressure of 3 mmHg. IAS/Shunts: No atrial level shunt detected by color flow Doppler.  LEFT VENTRICLE PLAX 2D LVIDd:         4.01 cm   Diastology LVIDs:         2.90 cm   LV e' medial:    7.51 cm/s LV PW:          0.88 cm   LV E/e' medial:  10.6 LV IVS:        0.44 cm   LV e' lateral:   11.90 cm/s LVOT diam:     1.90 cm   LV E/e' lateral: 6.7 LV SV:         50 LV SV Index:   35 LVOT Area:     2.84 cm  RIGHT VENTRICLE RV Basal diam:  2.46 cm RV S prime:     11.20 cm/s TAPSE (M-mode): 2.2 cm LEFT ATRIUM             Index        RIGHT ATRIUM           Index LA diam:        2.70 cm 1.90 cm/m   RA Area:     12.40 cm LA Vol (A2C):   46.4 ml 32.69 ml/m  RA Volume:   28.10 ml  19.80 ml/m LA Vol (A4C):   30.1 ml 21.21 ml/m LA Biplane Vol: 39.7 ml 27.97 ml/m  AORTIC VALVE                    PULMONIC VALVE AV Area (Vmax):    1.77 cm     PV Vmax:       0.73 m/s AV Area (Vmean):   1.82 cm     PV Peak grad:  2.1 mmHg AV Area (VTI):     1.68 cm AV Vmax:           133.00 cm/s AV Vmean:          92.900 cm/s AV VTI:            0.296 m AV Peak Grad:      7.1 mmHg AV Mean Grad:      4.0 mmHg LVOT Vmax:         82.90 cm/s LVOT Vmean:        59.700 cm/s LVOT VTI:          0.175 m LVOT/AV VTI ratio: 0.59 AI PHT:            1360 msec  AORTA Ao Root diam: 3.00 cm MITRAL VALVE MV Area (PHT): 3.63 cm    SHUNTS MV Decel Time: 209 msec    Systemic VTI:  0.18 m MV E velocity: 79.30 cm/s  Systemic Diam: 1.90 cm MV A velocity: 55.30 cm/s MV E/A ratio:  1.43 Marcina MillardAlexander Paraschos MD Electronically signed by Marcina MillardAlexander Paraschos MD Signature Date/Time: 11/10/2021/12:21:37 PM    Final    CT Angio Chest PE  W and/or Wo Contrast  Result Date: 11/09/2021 CLINICAL DATA:  Chest pain for 2 days radiating into the right side of the back. Pulmonary embolism suspected. High probability. EXAM: CT ANGIOGRAPHY CHEST WITH CONTRAST TECHNIQUE: Multidetector CT imaging of the chest was performed using the standard protocol during bolus administration of intravenous contrast. Multiplanar CT image reconstructions and MIPs were obtained to evaluate the vascular anatomy. RADIATION DOSE REDUCTION: This exam was performed according to the departmental dose-optimization  program which includes automated exposure control, adjustment of the mA and/or kV according to patient size and/or use of iterative reconstruction technique. CONTRAST:  29mL OMNIPAQUE IOHEXOL 350 MG/ML SOLN COMPARISON:  PA and lateral chest today is the only relevant prior. FINDINGS: Cardiovascular: The cardiac size is upper limits of normal. There is no pericardial effusion. There are no visible coronary artery calcifications. No arterial dilatation or embolus is seen. The aorta is normal in course and caliber without visible plaques, aneurysm or dissection. No great vessel stenosis or plaques are seen. There are mildly distended central pulmonary veins. Mediastinum/Nodes: No enlarged mediastinal, hilar, or axillary lymph nodes. Thyroid gland, trachea, and esophagus demonstrate no significant findings. There is incidentally noted mild elevation of the right hemidiaphragm. Lungs/Pleura: There is mild coarse reticulated scarring at the extreme lung apices. No infiltrate or nodule is seen. There is mild posterior atelectasis in the lower lobes. Central airways are patent. Upper Abdomen: No acute abnormality. There is a 1.3 cm hypodensity in the posterior segment of the right lobe of the liver measuring 33 Hounsfield units above the density of fluid therefore indeterminate. Nonemergent liver dedicated imaging with MRI or CT recommended. In the dome of the anterior segment of the right lobe there is a 1 cm cyst of 21 Hounsfield units. There are additional scattered tiny hypodensities in the liver substance which are too small to characterize. Musculoskeletal: There is mild-to-moderate broad-based thoracic dextroscoliosis. There are degenerative changes in the lower thoracic spine. Review of the MIP images confirms the above findings. IMPRESSION: 1. Borderline cardiomegaly with mildly prominent central pulmonary veins. No edema or other acute chest findings. 2. No evidence of arterial dilatation, arterial embolic filling  defects, or right heart strain findings. 3. There is a 1 cm cyst in the dome of the right hepatic anterior segment and a 1.3 cm indeterminate hypodensity in the posterior segment. Further evaluation recommended. 4. Additional scattered tiny too small to characterize hypodensities elsewhere in the liver. Electronically Signed   By: Almira Bar M.D.   On: 11/09/2021 22:49   DG Chest 2 View  Result Date: 11/09/2021 CLINICAL DATA:  CP EXAM: CHEST - 2 VIEW COMPARISON:  None Available. FINDINGS: The heart and mediastinal contours are within normal limits. No focal consolidation. No pulmonary edema. No pleural effusion. No pneumothorax. No acute osseous abnormality.  Midthoracic dextrocurvature. IMPRESSION: No active cardiopulmonary disease. Electronically Signed   By: Tish Frederickson M.D.   On: 11/09/2021 21:14     Assessment and Plan:   Chest pain with history of SCAD -Hs troponins negative x 4 -echocardiogram completed with no wall motion abnormalities -continue heparin drip for 48 hours -start on imdur 15 mg daily first dose now  -Myoview in AM for continued chest pain with known LAD disease/SCAD -NPO after midnight for myoview in AM -continue asa -continue cardiac monitor -patient previously had to have metoprolol and amlodipine stopped due to soft blood pressures and orthostatic  2.Shortness of breath -CTA chest negative for PE -BNP 10.3 -echocardiogram revealed LVEF 55-60% -currently  on room air -continuous pulse ox  3. Hyperlipidemia -LDL 115, not at goal -continue atorvastatin   4.Normocytic anemia -hgb 11.5 -no active bleeding -daily cbc  5. Asthma -stable -continue rescue inhaler as needed  Risk Assessment/Risk Scores:     TIMI Risk Score for Unstable Angina or Non-ST Elevation MI:   The patient's TIMI risk score is  , which indicates a  % risk of all cause mortality, new or recurrent myocardial infarction or need for urgent revascularization in the next 14 days.           For questions or updates, please contact [facility] Please consult [email or web address] for contact info under    Signed, Shephanie Romas, NP  11/10/2021 2:48 PM

## 2021-11-10 NOTE — TOC CM/SW Note (Signed)
CSW acknowledges consult for home health/DME needs. Please consult PT and OT so they can evaluate for needs.  Charlynn Court, CSW [phone]

## 2021-11-10 NOTE — Assessment & Plan Note (Addendum)
Suspect right sided heart failure.  We will get 2 d echo and CTA chest neg for pe: 1. Borderline cardiomegaly with mildly prominent central pulmonary veins. No edema or other acute chest findings. 2. No evidence of arterial dilatation, arterial embolic filling defects, or right heart strain findings. 3. There is a 1 cm cyst in the dome of the right hepatic anterior segment and a 1.3 cm indeterminate hypodensity in the posterior segment. Further evaluation recommended. 4. Additional scattered tiny too small to characterize hypodensities elsewhere in the liver.

## 2021-11-10 NOTE — Progress Notes (Signed)
*  PRELIMINARY RESULTS* Echocardiogram 2D Echocardiogram has been performed.  Nicole Simpson Nicole Simpson 11/10/2021, 11:45 AM

## 2021-11-10 NOTE — Progress Notes (Signed)
ANTICOAGULATION CONSULT NOTE   Pharmacy Consult for heparin infusion Indication: ACS/STEMI  Allergies  Allergen Reactions   Cafergot [Ergotamine-Caffeine]     Patient Measurements: Height: 5' (152.4 cm) Weight: 47.6 kg (105 lb) IBW/kg (Calculated) : 45.5 Heparin Dosing Weight: 47.6 kg  Vital Signs: Temp: 97.8 F (36.6 C) (06/28 0747) Temp Source: Oral (06/28 0747) BP: 109/71 (06/28 0800) Pulse Rate: 73 (06/28 0800)  Labs: Recent Labs    11/09/21 2028 11/09/21 2226 11/10/21 0222 11/10/21 0629  HGB 11.6*  --   --  11.5*  HCT 35.1*  --   --  35.2*  PLT 185  --   --  176  APTT 31  --   --   --   LABPROT 13.0  --   --   --   INR 1.0  --   --   --   HEPARINUNFRC  --   --   --  0.47  CREATININE 0.76  --   --   --   TROPONINIHS 4 4 3 3      Estimated Creatinine Clearance: 55.1 mL/min (by C-G formula based on SCr of 0.76 mg/dL).   Medical History: Past Medical History:  Diagnosis Date   Insomnia    Migraine    Assessment: Pt is 56 yo female w/ hx of NSTEMI 2022, presenting to ED d/t worsening CP & SOB over past 2 days.  HL 6/28 0630 was 0.4, within range of therapy.  Goal of Therapy:  Heparin level 0.3-0.7 units/ml Monitor platelets by anticoagulation protocol: Yes   Plan:  No change Continue heparin infusion at 600 units/hr Check HL at 1230 CBC daily while on heparin  7/28, PharmD Student 11/10/2021 9:35 AM

## 2021-11-10 NOTE — Assessment & Plan Note (Signed)
We will follow. No symptoms of any bleeding.

## 2021-11-10 NOTE — Assessment & Plan Note (Signed)
Stable. Prn Albuterol.

## 2021-11-10 NOTE — Progress Notes (Signed)
PROGRESS NOTE    Nicole Simpson  UUV:[id number] DOB: 05-05-1966 DOA: 11/09/2021 PCP: Rondel Oh, NP   Assessment & Plan:   Principal Problem:   Chest pain Active Problems:   Unstable angina (HCC)   SOB (shortness of breath)   Asthma   Hyperlipidemia   Anemia  Assessment and Plan: Chest pain: w/ shortness of breath. Unlikely ACS. Troponins neg x 3. Pt is requesting to see [facility] instead of [facility]. Hx of CAD. Cardio consulted ([facility])  HLD: continue on statin  Normocytic anemia: H&H are stable. No need for a transfusion currently   Asthma: unknown stage and/or severity. Continue on bronchodilators   DVT prophylaxis: lovenox  Code Status: full  Family Communication:  Disposition Plan: likely d/c back home   Level of care: Progressive Status is: Observation The patient remains OBS appropriate and will d/c before 2 midnights.    Consultants:  Cardio   Procedures:  Antimicrobials:    Subjective: Pt c/o chest pain   Objective: Vitals:   11/10/21 0359 11/10/21 0700 11/10/21 0747 11/10/21 0800  BP: 130/64  112/75 109/71  Pulse: 70 78 72 73  Resp: 16 18 15 15   Temp:   97.8 F (36.6 C)   TempSrc:   Oral   SpO2: 99% 99% 100% 100%  Weight:      Height:       No intake or output data in the 24 hours ending 11/10/21 0914 Filed Weights   11/09/21 2024  Weight: 47.6 kg    Examination:  General exam: Appears calm and comfortable  Respiratory system: Clear to auscultation. Respiratory effort normal. Cardiovascular system: S1 & S2 +. No rubs, gallops or clicks.  Gastrointestinal system: Abdomen is nondistended, soft and nontender. Normal bowel sounds heard. Central nervous system: Alert and oriented. Moves all extremities  Psychiatry: Judgement and insight appear normal. Mood & affect appropriate.     Data Reviewed: I have personally reviewed following labs and imaging studies  CBC: Recent Labs  Lab 11/09/21 2028 11/10/21 0629   WBC 6.9 5.5  HGB 11.6* 11.5*  HCT 35.1* 35.2*  MCV 84.6 85.4  PLT 185 176   Basic Metabolic Panel: Recent Labs  Lab 11/09/21 2028  NA 141  K 3.6  CL 110  CO2 21*  GLUCOSE 104*  BUN 19  CREATININE 0.76  CALCIUM 9.0   GFR: Estimated Creatinine Clearance: 55.1 mL/min (by C-G formula based on SCr of 0.76 mg/dL). Liver Function Tests: No results for input(s): "AST", "ALT", "ALKPHOS", "BILITOT", "PROT", "ALBUMIN" in the last 168 hours. Recent Labs  Lab 11/09/21 2028  LIPASE 45   No results for input(s): "AMMONIA" in the last 168 hours. Coagulation Profile: Recent Labs  Lab 11/09/21 2028  INR 1.0   Cardiac Enzymes: No results for input(s): "CKTOTAL", "CKMB", "CKMBINDEX", "TROPONINI" in the last 168 hours. BNP (last 3 results) No results for input(s): "PROBNP" in the last 8760 hours. HbA1C: No results for input(s): "HGBA1C" in the last 72 hours. CBG: No results for input(s): "GLUCAP" in the last 168 hours. Lipid Profile: Recent Labs    11/10/21 0629  CHOL 214*  HDL 88  LDLCALC 115*  TRIG 53  CHOLHDL 2.4   Thyroid Function Tests: Recent Labs    11/09/21 2226  TSH 2.265  FREET4 1.02   Anemia Panel: No results for input(s): "VITAMINB12", "FOLATE", "FERRITIN", "TIBC", "IRON", "RETICCTPCT" in the last 72 hours. Sepsis Labs: No results for input(s): "PROCALCITON", "LATICACIDVEN" in the last 168  hours.  No results found for this or any previous visit (from the past 240 hour(s)).       Radiology Studies: CT Angio Chest PE W and/or Wo Contrast  Result Date: 11/09/2021 CLINICAL DATA:  Chest pain for 2 days radiating into the right side of the back. Pulmonary embolism suspected. High probability. EXAM: CT ANGIOGRAPHY CHEST WITH CONTRAST TECHNIQUE: Multidetector CT imaging of the chest was performed using the standard protocol during bolus administration of intravenous contrast. Multiplanar CT image reconstructions and MIPs were obtained to evaluate the  vascular anatomy. RADIATION DOSE REDUCTION: This exam was performed according to the departmental dose-optimization program which includes automated exposure control, adjustment of the mA and/or kV according to patient size and/or use of iterative reconstruction technique. CONTRAST:  58mL OMNIPAQUE IOHEXOL 350 MG/ML SOLN COMPARISON:  PA and lateral chest today is the only relevant prior. FINDINGS: Cardiovascular: The cardiac size is upper limits of normal. There is no pericardial effusion. There are no visible coronary artery calcifications. No arterial dilatation or embolus is seen. The aorta is normal in course and caliber without visible plaques, aneurysm or dissection. No great vessel stenosis or plaques are seen. There are mildly distended central pulmonary veins. Mediastinum/Nodes: No enlarged mediastinal, hilar, or axillary lymph nodes. Thyroid gland, trachea, and esophagus demonstrate no significant findings. There is incidentally noted mild elevation of the right hemidiaphragm. Lungs/Pleura: There is mild coarse reticulated scarring at the extreme lung apices. No infiltrate or nodule is seen. There is mild posterior atelectasis in the lower lobes. Central airways are patent. Upper Abdomen: No acute abnormality. There is a 1.3 cm hypodensity in the posterior segment of the right lobe of the liver measuring 33 Hounsfield units above the density of fluid therefore indeterminate. Nonemergent liver dedicated imaging with MRI or CT recommended. In the dome of the anterior segment of the right lobe there is a 1 cm cyst of 21 Hounsfield units. There are additional scattered tiny hypodensities in the liver substance which are too small to characterize. Musculoskeletal: There is mild-to-moderate broad-based thoracic dextroscoliosis. There are degenerative changes in the lower thoracic spine. Review of the MIP images confirms the above findings. IMPRESSION: 1. Borderline cardiomegaly with mildly prominent central  pulmonary veins. No edema or other acute chest findings. 2. No evidence of arterial dilatation, arterial embolic filling defects, or right heart strain findings. 3. There is a 1 cm cyst in the dome of the right hepatic anterior segment and a 1.3 cm indeterminate hypodensity in the posterior segment. Further evaluation recommended. 4. Additional scattered tiny too small to characterize hypodensities elsewhere in the liver. Electronically Signed   By: Almira Bar M.D.   On: 11/09/2021 22:49   DG Chest 2 View  Result Date: 11/09/2021 CLINICAL DATA:  CP EXAM: CHEST - 2 VIEW COMPARISON:  None Available. FINDINGS: The heart and mediastinal contours are within normal limits. No focal consolidation. No pulmonary edema. No pleural effusion. No pneumothorax. No acute osseous abnormality.  Midthoracic dextrocurvature. IMPRESSION: No active cardiopulmonary disease. Electronically Signed   By: Tish Frederickson M.D.   On: 11/09/2021 21:14        Scheduled Meds:  aspirin EC  81 mg Oral Daily   atorvastatin  40 mg Oral q1800   sodium chloride flush  3 mL Intravenous Q12H   Continuous Infusions:  sodium chloride     sodium chloride     heparin 600 Units/hr (11/09/21 2321)     LOS: 0 days    Time spent: 73  mins     Charise Killian, MD [facility] Pager [phone]  If 7PM-7AM, please contact night-coverage 11/10/2021, 9:14 AM

## 2021-11-11 DIAGNOSIS — D649 Anemia, unspecified: Secondary | ICD-10-CM | POA: Diagnosis not present

## 2021-11-11 DIAGNOSIS — E7849 Other hyperlipidemia: Secondary | ICD-10-CM | POA: Diagnosis not present

## 2021-11-11 DIAGNOSIS — R079 Chest pain, unspecified: Secondary | ICD-10-CM | POA: Diagnosis not present

## 2021-11-11 LAB — BASIC METABOLIC PANEL
Anion gap: 3 — ABNORMAL LOW (ref 5–15)
BUN: 12 mg/dL (ref 6–20)
CO2: 29 mmol/L (ref 22–32)
Calcium: 9.6 mg/dL (ref 8.9–10.3)
Chloride: 108 mmol/L (ref 98–111)
Creatinine, Ser: 0.78 mg/dL (ref 0.44–1.00)
GFR, Estimated: >60 mL/min (ref >60)
Glucose, Bld: 107 mg/dL — ABNORMAL HIGH (ref 70–99)
Potassium: 4.3 mmol/L (ref 3.5–5.1)
Sodium: 140 mmol/L (ref 135–145)

## 2021-11-11 LAB — CBC
HCT: 38.6 % (ref 36.0–46.0)
Hemoglobin: 12.4 g/dL (ref 12.0–15.0)
MCH: 27.5 pg (ref 26.0–34.0)
MCHC: 32.1 g/dL (ref 30.0–36.0)
MCV: 85.6 fL (ref 80.0–100.0)
Platelets: 205 10*3/uL (ref 150–400)
RBC: 4.51 MIL/uL (ref 3.87–5.11)
RDW: 13.2 % (ref 11.5–15.5)
WBC: 5.6 10*3/uL (ref 4.0–10.5)
nRBC: 0 % (ref 0.0–0.2)

## 2021-11-11 LAB — LIPOPROTEIN A (LPA): Lipoprotein (a): 177 nmol/L — ABNORMAL HIGH (ref <75.0)

## 2021-11-11 NOTE — Progress Notes (Signed)
Patient is stating that she does not want to proceed with the Myoview and is asking if her NPO order can be changed. States that her ride will be at the hospital between 1000-1100. Patient removed telemetry monitor. Patient currently eating snack from home. Bishop Limbo, NP notified.

## 2021-11-11 NOTE — Discharge Summary (Signed)
Physician Discharge Summary  LINCY BELLES Feliciano GUR:[id number] DOB: Dec 17, 1965 DOA: 11/09/2021  PCP: Rondel Oh, NP  Admit date: 11/09/2021 Discharge date: 11/11/2021  Admitted From: home  Disposition:  Pt left AMA  Recommendations for Outpatient Follow-up:  Pt left AMA   Home Health: no  Equipment/Devices:  Discharge Condition: stable CODE STATUS: full  Diet recommendation: Heart Healthy  HPI was taken from Dr. Irena Cords: Nicole Simpson is a 55 y.o. female with medical history significant of CAD, chest pain and sob and worse today while using her shopping cart at [employer].  Patient describes severe shortness of breath and fatigue and lack of energy and not being able to keep up. Patient otherwise does not report any headaches blurred vision speech or gait issues abdominal pain fevers chills nausea vomiting diarrhea any bladder or bowel complaints otherwise.   As per Dr. Mayford Knife 6/28-6/29/23: Pt presented w/ atypical chest pain of unknown etiology. Pt requested to see [facility] cardio instead of her regular [facility] cardio as she was not satisfied with their care. Troponins were neg x 3. Echo showed normal systolic & diastolic function & CT chest showed no edema. Pt was schedule for cardiac stress test on 11/11/21 but pt refused and signed out AMA.    Discharge Diagnoses:  Principal Problem:   Chest pain Active Problems:   Unstable angina (HCC)   SOB (shortness of breath)   Asthma   Hyperlipidemia   Anemia  Chest pain: w/ shortness of breath. Unlikely ACS. Troponins neg x 3. Pt is requesting to see [facility] instead of [facility]. Hx of CAD. Cardiac stress test today but now pt is refusing and signed out AMA   HLD: continue on statin  Normocytic anemia: H&H are stable. No need for a transfusion currently   Asthma: unknown stage and/or severity. Continue on bronchodilators   Discharge Instructions     Allergies  Allergen Reactions   Cafergot  [Ergotamine-Caffeine]     Consultations: Cardio    Procedures/Studies: ECHOCARDIOGRAM COMPLETE  Result Date: 11/10/2021    ECHOCARDIOGRAM REPORT   Patient Name:   Nicole Simpson [facility] Date of Exam: 11/10/2021 Medical Rec #:  [id number]             Height:       60.0 in Accession #:    [id number]            Weight:       105.0 lb Date of Birth:  [birth year]             BSA:          1.419 m Patient Age:    56 years              BP:           109/71 mmHg Patient Gender: F                     HR:           62 bpm. Exam Location:  [facility] Procedure: 2D Echo, Color Doppler and Cardiac Doppler Indications:     R07.9 Chest Pain  History:         Patient has no prior history of Echocardiogram examinations.                  CAD; Signs/Symptoms:Chest Pain and Shortness of Breath.  Sonographer:     Humphrey Rolls Referring Phys:  [id number] EKTA V PATEL Diagnosing Phys:  Isaias Cowman MD IMPRESSIONS  1. Left ventricular ejection fraction, by estimation, is 55 to 60%. The left ventricle has normal function. The left ventricle has no regional wall motion abnormalities. Left ventricular diastolic parameters were normal.  2. Right ventricular systolic function is normal. The right ventricular size is normal.  3. The mitral valve is normal in structure. Mild mitral valve regurgitation. No evidence of mitral stenosis.  4. The aortic valve is normal in structure. Aortic valve regurgitation is not visualized. No aortic stenosis is present.  5. The inferior vena cava is normal in size with greater than 50% respiratory variability, suggesting right atrial pressure of 3 mmHg. FINDINGS  Left Ventricle: Left ventricular ejection fraction, by estimation, is 55 to 60%. The left ventricle has normal function. The left ventricle has no regional wall motion abnormalities. The left ventricular internal cavity size was normal in size. There is  no left ventricular hypertrophy. Left ventricular diastolic parameters were normal. Right Ventricle:  The right ventricular size is normal. No increase in right ventricular wall thickness. Right ventricular systolic function is normal. Left Atrium: Left atrial size was normal in size. Right Atrium: Right atrial size was normal in size. Pericardium: There is no evidence of pericardial effusion. Mitral Valve: The mitral valve is normal in structure. Mild mitral valve regurgitation. No evidence of mitral valve stenosis. Tricuspid Valve: The tricuspid valve is normal in structure. Tricuspid valve regurgitation is mild . No evidence of tricuspid stenosis. Aortic Valve: The aortic valve is normal in structure. Aortic valve regurgitation is not visualized. Aortic regurgitation PHT measures 1360 msec. No aortic stenosis is present. Aortic valve mean gradient measures 4.0 mmHg. Aortic valve peak gradient measures 7.1 mmHg. Aortic valve area, by VTI measures 1.68 cm. Pulmonic Valve: The pulmonic valve was normal in structure. Pulmonic valve regurgitation is not visualized. No evidence of pulmonic stenosis. Aorta: The aortic root is normal in size and structure. Venous: The inferior vena cava is normal in size with greater than 50% respiratory variability, suggesting right atrial pressure of 3 mmHg. IAS/Shunts: No atrial level shunt detected by color flow Doppler.  LEFT VENTRICLE PLAX 2D LVIDd:         4.01 cm   Diastology LVIDs:         2.90 cm   LV e' medial:    7.51 cm/s LV PW:         0.88 cm   LV E/e' medial:  10.6 LV IVS:        0.44 cm   LV e' lateral:   11.90 cm/s LVOT diam:     1.90 cm   LV E/e' lateral: 6.7 LV SV:         50 LV SV Index:   35 LVOT Area:     2.84 cm  RIGHT VENTRICLE RV Basal diam:  2.46 cm RV S prime:     11.20 cm/s TAPSE (M-mode): 2.2 cm LEFT ATRIUM             Index        RIGHT ATRIUM           Index LA diam:        2.70 cm 1.90 cm/m   RA Area:     12.40 cm LA Vol (A2C):   46.4 ml 32.69 ml/m  RA Volume:   28.10 ml  19.80 ml/m LA Vol (A4C):   30.1 ml 21.21 ml/m LA Biplane Vol: 39.7 ml 27.97  ml/m  AORTIC VALVE  PULMONIC VALVE AV Area (Vmax):    1.77 cm     PV Vmax:       0.73 m/s AV Area (Vmean):   1.82 cm     PV Peak grad:  2.1 mmHg AV Area (VTI):     1.68 cm AV Vmax:           133.00 cm/s AV Vmean:          92.900 cm/s AV VTI:            0.296 m AV Peak Grad:      7.1 mmHg AV Mean Grad:      4.0 mmHg LVOT Vmax:         82.90 cm/s LVOT Vmean:        59.700 cm/s LVOT VTI:          0.175 m LVOT/AV VTI ratio: 0.59 AI PHT:            1360 msec  AORTA Ao Root diam: 3.00 cm MITRAL VALVE MV Area (PHT): 3.63 cm    SHUNTS MV Decel Time: 209 msec    Systemic VTI:  0.18 m MV E velocity: 79.30 cm/s  Systemic Diam: 1.90 cm MV A velocity: 55.30 cm/s MV E/A ratio:  1.43 Isaias Cowman MD Electronically signed by Isaias Cowman MD Signature Date/Time: 11/10/2021/12:21:37 PM    Final    CT Angio Chest PE W and/or Wo Contrast  Result Date: 11/09/2021 CLINICAL DATA:  Chest pain for 2 days radiating into the right side of the back. Pulmonary embolism suspected. High probability. EXAM: CT ANGIOGRAPHY CHEST WITH CONTRAST TECHNIQUE: Multidetector CT imaging of the chest was performed using the standard protocol during bolus administration of intravenous contrast. Multiplanar CT image reconstructions and MIPs were obtained to evaluate the vascular anatomy. RADIATION DOSE REDUCTION: This exam was performed according to the departmental dose-optimization program which includes automated exposure control, adjustment of the mA and/or kV according to patient size and/or use of iterative reconstruction technique. CONTRAST:  58mL OMNIPAQUE IOHEXOL 350 MG/ML SOLN COMPARISON:  PA and lateral chest today is the only relevant prior. FINDINGS: Cardiovascular: The cardiac size is upper limits of normal. There is no pericardial effusion. There are no visible coronary artery calcifications. No arterial dilatation or embolus is seen. The aorta is normal in course and caliber without visible plaques,  aneurysm or dissection. No great vessel stenosis or plaques are seen. There are mildly distended central pulmonary veins. Mediastinum/Nodes: No enlarged mediastinal, hilar, or axillary lymph nodes. Thyroid gland, trachea, and esophagus demonstrate no significant findings. There is incidentally noted mild elevation of the right hemidiaphragm. Lungs/Pleura: There is mild coarse reticulated scarring at the extreme lung apices. No infiltrate or nodule is seen. There is mild posterior atelectasis in the lower lobes. Central airways are patent. Upper Abdomen: No acute abnormality. There is a 1.3 cm hypodensity in the posterior segment of the right lobe of the liver measuring 33 Hounsfield units above the density of fluid therefore indeterminate. Nonemergent liver dedicated imaging with MRI or CT recommended. In the dome of the anterior segment of the right lobe there is a 1 cm cyst of 21 Hounsfield units. There are additional scattered tiny hypodensities in the liver substance which are too small to characterize. Musculoskeletal: There is mild-to-moderate broad-based thoracic dextroscoliosis. There are degenerative changes in the lower thoracic spine. Review of the MIP images confirms the above findings. IMPRESSION: 1. Borderline cardiomegaly with mildly prominent central pulmonary veins. No edema or other acute  chest findings. 2. No evidence of arterial dilatation, arterial embolic filling defects, or right heart strain findings. 3. There is a 1 cm cyst in the dome of the right hepatic anterior segment and a 1.3 cm indeterminate hypodensity in the posterior segment. Further evaluation recommended. 4. Additional scattered tiny too small to characterize hypodensities elsewhere in the liver. Electronically Signed   By: Almira Bar M.D.   On: 11/09/2021 22:49   DG Chest 2 View  Result Date: 11/09/2021 CLINICAL DATA:  CP EXAM: CHEST - 2 VIEW COMPARISON:  None Available. FINDINGS: The heart and mediastinal contours are  within normal limits. No focal consolidation. No pulmonary edema. No pleural effusion. No pneumothorax. No acute osseous abnormality.  Midthoracic dextrocurvature. IMPRESSION: No active cardiopulmonary disease. Electronically Signed   By: Tish Frederickson M.D.   On: 11/09/2021 21:14   (Echo, Carotid, EGD, Colonoscopy, ERCP)    Subjective: Pt left AMA   Discharge Exam: Vitals:   11/11/21 0432 11/11/21 0447  BP: (!) 94/48 101/67  Pulse: 66 69  Resp: 18 18  Temp: (!) 97.4 F (36.3 C)   SpO2: 98%    Vitals:   11/10/21 1913 11/11/21 0004 11/11/21 0432 11/11/21 0447  BP: 113/76 (!) 101/57 (!) 94/48 101/67  Pulse: 70 63 66 69  Resp: 18 18 18 18   Temp: (!) 97.5 F (36.4 C) 97.7 F (36.5 C) (!) 97.4 F (36.3 C)   TempSrc:      SpO2: 100% 99% 98%   Weight:      Height:       No PE was done today as pt left AMA before I saw the pt    The results of significant diagnostics from this hospitalization (including imaging, microbiology, ancillary and laboratory) are listed below for reference.     Microbiology: No results found for this or any previous visit (from the past 240 hour(s)).   Labs: BNP (last 3 results) Recent Labs    11/09/21 2028  BNP 10.3   Basic Metabolic Panel: Recent Labs  Lab 11/09/21 2028 11/11/21 0550  NA 141 140  K 3.6 4.3  CL 110 108  CO2 21* 29  GLUCOSE 104* 107*  BUN 19 12  CREATININE 0.76 0.78  CALCIUM 9.0 9.6   Liver Function Tests: No results for input(s): "AST", "ALT", "ALKPHOS", "BILITOT", "PROT", "ALBUMIN" in the last 168 hours. Recent Labs  Lab 11/09/21 2028  LIPASE 45   No results for input(s): "AMMONIA" in the last 168 hours. CBC: Recent Labs  Lab 11/09/21 2028 11/10/21 0629 11/11/21 0550  WBC 6.9 5.5 5.6  HGB 11.6* 11.5* 12.4  HCT 35.1* 35.2* 38.6  MCV 84.6 85.4 85.6  PLT 185 176 205   Cardiac Enzymes: No results for input(s): "CKTOTAL", "CKMB", "CKMBINDEX", "TROPONINI" in the last 168 hours. BNP: Invalid input(s):  "POCBNP" CBG: No results for input(s): "GLUCAP" in the last 168 hours. D-Dimer Recent Labs    11/09/21 2028  DDIMER 0.54*   Hgb A1c No results for input(s): "HGBA1C" in the last 72 hours. Lipid Profile Recent Labs    11/10/21 0629  CHOL 214*  HDL 88  LDLCALC 115*  TRIG 53  CHOLHDL 2.4   Thyroid function studies Recent Labs    11/09/21 2226  TSH 2.265   Anemia work up No results for input(s): "VITAMINB12", "FOLATE", "FERRITIN", "TIBC", "IRON", "RETICCTPCT" in the last 72 hours. Urinalysis    Component Value Date/Time   COLORURINE YELLOW (A) 05/23/2019 1823   APPEARANCEUR HAZY (A)  05/23/2019 1823   APPEARANCEUR CLOUDY 09/30/2011 1509   LABSPEC 1.020 05/23/2019 1823   LABSPEC see comment 09/30/2011 1509   PHURINE 5.0 05/23/2019 1823   GLUCOSEU NEGATIVE 05/23/2019 1823   GLUCOSEU see comment 09/30/2011 1509   HGBUR SMALL (A) 05/23/2019 1823   BILIRUBINUR NEGATIVE 05/23/2019 1823   BILIRUBINUR see comment 09/30/2011 [place] 05/23/2019 1823   PROTEINUR NEGATIVE 05/23/2019 1823   NITRITE NEGATIVE 05/23/2019 1823   LEUKOCYTESUR MODERATE (A) 05/23/2019 1823   LEUKOCYTESUR see comment 09/30/2011 1509   Sepsis Labs Recent Labs  Lab 11/09/21 2028 11/10/21 0629 11/11/21 0550  WBC 6.9 5.5 5.6   Microbiology No results found for this or any previous visit (from the past 240 hour(s)).   Time coordinating discharge: Over 30 minutes  SIGNED:   Wyvonnia Dusky, MD  [facility] 11/11/2021, 2:08 PM Pager   If 7PM-7AM, please contact night-coverage [email or web address]

## 2021-11-11 NOTE — Progress Notes (Signed)
Progress Note Patient Name: Nicole Simpson Date of Encounter: 11/11/2021  [facility] HeartCare Cardiologist: None   Subjective   Patient seen this morning, previously seen by [facility] cardiology, patient requested [facility] heart care for further assessment of cardiac function.  Patient seen this morning, did not want stress test performed.  When asked why, she states being told "everything was fine and her symptoms are possibly from stress or lungs".    Patient states she misunderstood what was said yesterday regarding cardiac testing so far being normal.  Troponins were normal, chest CTA was negative for PE, no edema noted on chest CT, echo showed normal EF, normal diastolic function  I reiterated to patient that we are trying to do a Myoview to rule out any cardiac etiology.  Inpatient Medications    Scheduled Meds:  aspirin EC  81 mg Oral Daily   atorvastatin  40 mg Oral q1800   ranolazine  500 mg Oral BID   sodium chloride flush  3 mL Intravenous Q12H   Continuous Infusions:  sodium chloride     PRN Meds: sodium chloride, acetaminophen, nitroGLYCERIN, ondansetron (ZOFRAN) IV, sodium chloride flush   Vital Signs    Vitals:   11/10/21 1913 11/11/21 0004 11/11/21 0432 11/11/21 0447  BP: 113/76 (!) 101/57 (!) 94/48 101/67  Pulse: 70 63 66 69  Resp: 18 18 18 18   Temp: (!) 97.5 F (36.4 C) 97.7 F (36.5 C) (!) 97.4 F (36.3 C)   TempSrc:      SpO2: 100% 99% 98%   Weight:      Height:        Intake/Output Summary (Last 24 hours) at 11/11/2021 1133 Last data filed at 11/11/2021 1007 Gross per 24 hour  Intake 463.85 ml  Output --  Net 463.85 ml      11/09/2021    8:24 PM 05/23/2019    6:20 PM  Last 3 Weights  Weight (lbs) 105 lb 110 lb  Weight (kg) 47.628 kg 49.896 kg      Telemetry    Currently off telemetry- Personally Reviewed  ECG    New tracing reviewed- Personally Reviewed  Physical Exam   GEN: No acute distress.   Neck: No JVD Cardiac: Not  performed Respiratory: Not performed GI: BS non-distended  MS: No apparent deformity. Neuro:  Nonfocal  Psych: Normal affect   Labs    High Sensitivity Troponin:   Recent Labs  Lab 11/09/21 2028 11/09/21 2226 11/10/21 0222 11/10/21 0629  TROPONINIHS 4 4 3 3      Chemistry Recent Labs  Lab 11/09/21 2028 11/11/21 0550  NA 141 140  K 3.6 4.3  CL 110 108  CO2 21* 29  GLUCOSE 104* 107*  BUN 19 12  CREATININE 0.76 0.78  CALCIUM 9.0 9.6  GFRNONAA >60 >60  ANIONGAP 10 3*    Lipids  Recent Labs  Lab 11/10/21 0629  CHOL 214*  TRIG 53  HDL 88  LDLCALC 115*  CHOLHDL 2.4    Hematology Recent Labs  Lab 11/09/21 2028 11/10/21 0629 11/11/21 0550  WBC 6.9 5.5 5.6  RBC 4.15 4.12 4.51  HGB 11.6* 11.5* 12.4  HCT 35.1* 35.2* 38.6  MCV 84.6 85.4 85.6  MCH 28.0 27.9 27.5  MCHC 33.0 32.7 32.1  RDW 13.2 13.2 13.2  PLT 185 176 205   Thyroid  Recent Labs  Lab 11/09/21 2226  TSH 2.265  FREET4 1.02    BNP Recent Labs  Lab 11/09/21 2028  BNP  10.3    DDimer  Recent Labs  Lab 11/09/21 2028  DDIMER 0.54*     Radiology    ECHOCARDIOGRAM COMPLETE  Result Date: 11/10/2021    ECHOCARDIOGRAM REPORT   Patient Name:   LEEBA BARBE [facility] Date of Exam: 11/10/2021 Medical Rec #:  [id number]             Height:       60.0 in Accession #:    [id number]            Weight:       105.0 lb Date of Birth:  1965/12/28             BSA:          1.419 m Patient Age:    56 years              BP:           109/71 mmHg Patient Gender: F                     HR:           62 bpm. Exam Location:  [facility] Procedure: 2D Echo, Color Doppler and Cardiac Doppler Indications:     R07.9 Chest Pain  History:         Patient has no prior history of Echocardiogram examinations.                  CAD; Signs/Symptoms:Chest Pain and Shortness of Breath.  Sonographer:     Humphrey Rolls Referring Phys:  [id number] Eliezer Mccoy PATEL Diagnosing Phys: Marcina Millard MD IMPRESSIONS  1. Left ventricular ejection fraction,  by estimation, is 55 to 60%. The left ventricle has normal function. The left ventricle has no regional wall motion abnormalities. Left ventricular diastolic parameters were normal.  2. Right ventricular systolic function is normal. The right ventricular size is normal.  3. The mitral valve is normal in structure. Mild mitral valve regurgitation. No evidence of mitral stenosis.  4. The aortic valve is normal in structure. Aortic valve regurgitation is not visualized. No aortic stenosis is present.  5. The inferior vena cava is normal in size with greater than 50% respiratory variability, suggesting right atrial pressure of 3 mmHg. FINDINGS  Left Ventricle: Left ventricular ejection fraction, by estimation, is 55 to 60%. The left ventricle has normal function. The left ventricle has no regional wall motion abnormalities. The left ventricular internal cavity size was normal in size. There is  no left ventricular hypertrophy. Left ventricular diastolic parameters were normal. Right Ventricle: The right ventricular size is normal. No increase in right ventricular wall thickness. Right ventricular systolic function is normal. Left Atrium: Left atrial size was normal in size. Right Atrium: Right atrial size was normal in size. Pericardium: There is no evidence of pericardial effusion. Mitral Valve: The mitral valve is normal in structure. Mild mitral valve regurgitation. No evidence of mitral valve stenosis. Tricuspid Valve: The tricuspid valve is normal in structure. Tricuspid valve regurgitation is mild . No evidence of tricuspid stenosis. Aortic Valve: The aortic valve is normal in structure. Aortic valve regurgitation is not visualized. Aortic regurgitation PHT measures 1360 msec. No aortic stenosis is present. Aortic valve mean gradient measures 4.0 mmHg. Aortic valve peak gradient measures 7.1 mmHg. Aortic valve area, by VTI measures 1.68 cm. Pulmonic Valve: The pulmonic valve was normal in structure. Pulmonic  valve regurgitation is not visualized. No evidence of pulmonic stenosis. Aorta: The aortic  root is normal in size and structure. Venous: The inferior vena cava is normal in size with greater than 50% respiratory variability, suggesting right atrial pressure of 3 mmHg. IAS/Shunts: No atrial level shunt detected by color flow Doppler.  LEFT VENTRICLE PLAX 2D LVIDd:         4.01 cm   Diastology LVIDs:         2.90 cm   LV e' medial:    7.51 cm/s LV PW:         0.88 cm   LV E/e' medial:  10.6 LV IVS:        0.44 cm   LV e' lateral:   11.90 cm/s LVOT diam:     1.90 cm   LV E/e' lateral: 6.7 LV SV:         50 LV SV Index:   35 LVOT Area:     2.84 cm  RIGHT VENTRICLE RV Basal diam:  2.46 cm RV S prime:     11.20 cm/s TAPSE (M-mode): 2.2 cm LEFT ATRIUM             Index        RIGHT ATRIUM           Index LA diam:        2.70 cm 1.90 cm/m   RA Area:     12.40 cm LA Vol (A2C):   46.4 ml 32.69 ml/m  RA Volume:   28.10 ml  19.80 ml/m LA Vol (A4C):   30.1 ml 21.21 ml/m LA Biplane Vol: 39.7 ml 27.97 ml/m  AORTIC VALVE                    PULMONIC VALVE AV Area (Vmax):    1.77 cm     PV Vmax:       0.73 m/s AV Area (Vmean):   1.82 cm     PV Peak grad:  2.1 mmHg AV Area (VTI):     1.68 cm AV Vmax:           133.00 cm/s AV Vmean:          92.900 cm/s AV VTI:            0.296 m AV Peak Grad:      7.1 mmHg AV Mean Grad:      4.0 mmHg LVOT Vmax:         82.90 cm/s LVOT Vmean:        59.700 cm/s LVOT VTI:          0.175 m LVOT/AV VTI ratio: 0.59 AI PHT:            1360 msec  AORTA Ao Root diam: 3.00 cm MITRAL VALVE MV Area (PHT): 3.63 cm    SHUNTS MV Decel Time: 209 msec    Systemic VTI:  0.18 m MV E velocity: 79.30 cm/s  Systemic Diam: 1.90 cm MV A velocity: 55.30 cm/s MV E/A ratio:  1.43 Marcina Millard MD Electronically signed by Marcina Millard MD Signature Date/Time: 11/10/2021/12:21:37 PM    Final    CT Angio Chest PE W and/or Wo Contrast  Result Date: 11/09/2021 CLINICAL DATA:  Chest pain for 2 days  radiating into the right side of the back. Pulmonary embolism suspected. High probability. EXAM: CT ANGIOGRAPHY CHEST WITH CONTRAST TECHNIQUE: Multidetector CT imaging of the chest was performed using the standard protocol during bolus administration of intravenous contrast. Multiplanar CT image reconstructions and MIPs were obtained to  evaluate the vascular anatomy. RADIATION DOSE REDUCTION: This exam was performed according to the departmental dose-optimization program which includes automated exposure control, adjustment of the mA and/or kV according to patient size and/or use of iterative reconstruction technique. CONTRAST:  43mL OMNIPAQUE IOHEXOL 350 MG/ML SOLN COMPARISON:  PA and lateral chest today is the only relevant prior. FINDINGS: Cardiovascular: The cardiac size is upper limits of normal. There is no pericardial effusion. There are no visible coronary artery calcifications. No arterial dilatation or embolus is seen. The aorta is normal in course and caliber without visible plaques, aneurysm or dissection. No great vessel stenosis or plaques are seen. There are mildly distended central pulmonary veins. Mediastinum/Nodes: No enlarged mediastinal, hilar, or axillary lymph nodes. Thyroid gland, trachea, and esophagus demonstrate no significant findings. There is incidentally noted mild elevation of the right hemidiaphragm. Lungs/Pleura: There is mild coarse reticulated scarring at the extreme lung apices. No infiltrate or nodule is seen. There is mild posterior atelectasis in the lower lobes. Central airways are patent. Upper Abdomen: No acute abnormality. There is a 1.3 cm hypodensity in the posterior segment of the right lobe of the liver measuring 33 Hounsfield units above the density of fluid therefore indeterminate. Nonemergent liver dedicated imaging with MRI or CT recommended. In the dome of the anterior segment of the right lobe there is a 1 cm cyst of 21 Hounsfield units. There are additional  scattered tiny hypodensities in the liver substance which are too small to characterize. Musculoskeletal: There is mild-to-moderate broad-based thoracic dextroscoliosis. There are degenerative changes in the lower thoracic spine. Review of the MIP images confirms the above findings. IMPRESSION: 1. Borderline cardiomegaly with mildly prominent central pulmonary veins. No edema or other acute chest findings. 2. No evidence of arterial dilatation, arterial embolic filling defects, or right heart strain findings. 3. There is a 1 cm cyst in the dome of the right hepatic anterior segment and a 1.3 cm indeterminate hypodensity in the posterior segment. Further evaluation recommended. 4. Additional scattered tiny too small to characterize hypodensities elsewhere in the liver. Electronically Signed   By: Almira Bar M.D.   On: 11/09/2021 22:49   DG Chest 2 View  Result Date: 11/09/2021 CLINICAL DATA:  CP EXAM: CHEST - 2 VIEW COMPARISON:  None Available. FINDINGS: The heart and mediastinal contours are within normal limits. No focal consolidation. No pulmonary edema. No pleural effusion. No pneumothorax. No acute osseous abnormality.  Midthoracic dextrocurvature. IMPRESSION: No active cardiopulmonary disease. Electronically Signed   By: Tish Frederickson M.D.   On: 11/09/2021 21:14    Cardiac Studies   EF 55 to 60%  Patient Profile     56 y.o. 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  Assessment & Plan    Chest pain, history of scad. -Troponins normal, echo normal systolic and diastolic function, chest CT no edema -Not tolerate beta-blocker, amlodipine or Imdur in the past -Continue current meds as prescribed Ranexa, aspirin, Lipitor -She canceled Lexiscan Myoview which was scheduled for dyspnea -Follow-up with primary cardiologist -Evaluate other etiologies for chest pain as  previously noted -Talking to RN, patient signed AMA papers, planning on going home.  Total encounter time more than 50 minutes  Greater than 50% was spent in counseling and coordination of care with the patient   Signed, Debbe Odea, MD  11/11/2021, 11:33 AM

## 2021-11-11 NOTE — Progress Notes (Signed)
Patient has signed AMA form. Transportation will be arriving this morning to pick them up. Providers have been made aware.

## 2021-12-23 ENCOUNTER — Other Ambulatory Visit: Payer: Self-pay

## 2021-12-23 ENCOUNTER — Observation Stay
Admission: EM | Admit: 2021-12-23 | Discharge: 2021-12-24 | Disposition: A | Discharge status: Home / Self Care | Payer: Medicaid Other | Attending: Internal Medicine | Admitting: Internal Medicine

## 2021-12-23 ENCOUNTER — Encounter: Payer: Self-pay | Admitting: Emergency Medicine

## 2021-12-23 ENCOUNTER — Observation Stay: Payer: Medicaid Other

## 2021-12-23 ENCOUNTER — Emergency Department: Payer: Medicaid Other

## 2021-12-23 DIAGNOSIS — R0789 Other chest pain: Principal | ICD-10-CM | POA: Insufficient documentation

## 2021-12-23 DIAGNOSIS — Z79899 Other long term (current) drug therapy: Secondary | ICD-10-CM | POA: Insufficient documentation

## 2021-12-23 DIAGNOSIS — I25110 Atherosclerotic heart disease of native coronary artery with unstable angina pectoris: Secondary | ICD-10-CM | POA: Insufficient documentation

## 2021-12-23 DIAGNOSIS — R0600 Dyspnea, unspecified: Secondary | ICD-10-CM | POA: Diagnosis not present

## 2021-12-23 DIAGNOSIS — F419 Anxiety disorder, unspecified: Secondary | ICD-10-CM

## 2021-12-23 DIAGNOSIS — R079 Chest pain, unspecified: Secondary | ICD-10-CM | POA: Diagnosis not present

## 2021-12-23 DIAGNOSIS — Z8669 Personal history of other diseases of the nervous system and sense organs: Secondary | ICD-10-CM | POA: Diagnosis not present

## 2021-12-23 DIAGNOSIS — E876 Hypokalemia: Secondary | ICD-10-CM | POA: Insufficient documentation

## 2021-12-23 DIAGNOSIS — J4520 Mild intermittent asthma, uncomplicated: Secondary | ICD-10-CM

## 2021-12-23 DIAGNOSIS — J45909 Unspecified asthma, uncomplicated: Secondary | ICD-10-CM | POA: Insufficient documentation

## 2021-12-23 DIAGNOSIS — I200 Unstable angina: Secondary | ICD-10-CM

## 2021-12-23 LAB — CBC WITH DIFFERENTIAL/PLATELET
Abs Immature Granulocytes: 0.02 10*3/uL (ref 0.00–0.07)
Basophils Absolute: 0.1 10*3/uL (ref 0.0–0.1)
Basophils Relative: 1 %
Eosinophils Absolute: 0.1 10*3/uL (ref 0.0–0.5)
Eosinophils Relative: 2 %
HCT: 38 % (ref 36.0–46.0)
Hemoglobin: 12.7 g/dL (ref 12.0–15.0)
Immature Granulocytes: 0 %
Lymphocytes Relative: 41 %
Lymphs Abs: 2.4 10*3/uL (ref 0.7–4.0)
MCH: 28.3 pg (ref 26.0–34.0)
MCHC: 33.4 g/dL (ref 30.0–36.0)
MCV: 84.6 fL (ref 80.0–100.0)
Monocytes Absolute: 0.4 10*3/uL (ref 0.1–1.0)
Monocytes Relative: 7 %
Neutro Abs: 2.8 10*3/uL (ref 1.7–7.7)
Neutrophils Relative %: 49 %
Platelets: 226 10*3/uL (ref 150–400)
RBC: 4.49 MIL/uL (ref 3.87–5.11)
RDW: 13.5 % (ref 11.5–15.5)
WBC: 5.8 10*3/uL (ref 4.0–10.5)
nRBC: 0 % (ref 0.0–0.2)

## 2021-12-23 LAB — COMPREHENSIVE METABOLIC PANEL
ALT: 11 U/L (ref 0–44)
AST: 18 U/L (ref 15–41)
Albumin: 4.3 g/dL (ref 3.5–5.0)
Alkaline Phosphatase: 60 U/L (ref 38–126)
Anion gap: 10 (ref 5–15)
BUN: 16 mg/dL (ref 6–20)
CO2: 20 mmol/L — ABNORMAL LOW (ref 22–32)
Calcium: 9.3 mg/dL (ref 8.9–10.3)
Chloride: 109 mmol/L (ref 98–111)
Creatinine, Ser: 0.63 mg/dL (ref 0.44–1.00)
GFR, Estimated: >60 mL/min (ref >60)
Glucose, Bld: 104 mg/dL — ABNORMAL HIGH (ref 70–99)
Potassium: 3.1 mmol/L — ABNORMAL LOW (ref 3.5–5.1)
Sodium: 139 mmol/L (ref 135–145)
Total Bilirubin: 0.7 mg/dL (ref 0.3–1.2)
Total Protein: 7.5 g/dL (ref 6.5–8.1)

## 2021-12-23 LAB — TROPONIN I (HIGH SENSITIVITY)
Troponin I (High Sensitivity): 3 ng/L (ref <18)
Troponin I (High Sensitivity): 3 ng/L (ref <18)

## 2021-12-23 LAB — D-DIMER, QUANTITATIVE: D-Dimer, Quant: 0.51 ug/mL-FEU — ABNORMAL HIGH (ref 0.00–0.50)

## 2021-12-23 MED ORDER — GABAPENTIN 100 MG PO CAPS
100.0000 mg | ORAL_CAPSULE | Freq: Every day | ORAL | Status: DC
  Administered 2021-12-23: 100 mg via ORAL
  Filled 2021-12-23: qty 1

## 2021-12-23 MED ORDER — AMLODIPINE BESYLATE 5 MG PO TABS
2.5000 mg | ORAL_TABLET | Freq: Every day | ORAL | Status: DC
  Filled 2021-12-23 (×2): qty 1

## 2021-12-23 MED ORDER — CLONAZEPAM 1 MG PO TABS: 1.0000 mg | ORAL_TABLET | Freq: Every evening | ORAL | Status: DC | PRN

## 2021-12-23 MED ORDER — ONDANSETRON HCL 4 MG/2ML IJ SOLN: 4.0000 mg | Freq: Four times a day (QID) | INTRAMUSCULAR | Status: DC | PRN

## 2021-12-23 MED ORDER — ACETAMINOPHEN 325 MG PO TABS: 650.0000 mg | ORAL_TABLET | Freq: Four times a day (QID) | ORAL | Status: DC | PRN

## 2021-12-23 MED ORDER — POTASSIUM CHLORIDE CRYS ER 20 MEQ PO TBCR
40.0000 meq | EXTENDED_RELEASE_TABLET | Freq: Once | ORAL | Status: AC
  Administered 2021-12-23: 40 meq via ORAL
  Filled 2021-12-23: qty 2

## 2021-12-23 MED ORDER — IOHEXOL 350 MG/ML SOLN
75.0000 mL | Freq: Once | INTRAVENOUS | Status: AC | PRN
  Administered 2021-12-23: 75 mL via INTRAVENOUS

## 2021-12-23 MED ORDER — BUDESONIDE 0.25 MG/2ML IN SUSP
2.0000 mL | Freq: Two times a day (BID) | RESPIRATORY_TRACT | Status: DC
Start: 2021-12-23 — End: 2021-12-24
  Administered 2021-12-23 – 2021-12-24 (×2): 0.25 mg via RESPIRATORY_TRACT
  Filled 2021-12-23 (×2): qty 2

## 2021-12-23 MED ORDER — ENOXAPARIN SODIUM 40 MG/0.4ML IJ SOSY
40.0000 mg | PREFILLED_SYRINGE | INTRAMUSCULAR | Status: DC
  Administered 2021-12-23: 40 mg via SUBCUTANEOUS
  Filled 2021-12-23: qty 0.4

## 2021-12-23 MED ORDER — BUDESONIDE 0.25 MG/2ML IN SUSP
2.0000 mL | Freq: Two times a day (BID) | RESPIRATORY_TRACT | Status: DC
Start: 2021-12-23 — End: 2021-12-23
  Administered 2021-12-23: 0.25 mg via RESPIRATORY_TRACT
  Filled 2021-12-23: qty 2

## 2021-12-23 MED ORDER — ONDANSETRON HCL 4 MG PO TABS: 4.0000 mg | ORAL_TABLET | Freq: Four times a day (QID) | ORAL | Status: DC | PRN

## 2021-12-23 MED ORDER — ASPIRIN 81 MG PO TBEC
81.0000 mg | DELAYED_RELEASE_TABLET | Freq: Every day | ORAL | Status: DC
  Administered 2021-12-23 – 2021-12-24 (×2): 81 mg via ORAL
  Filled 2021-12-23 (×2): qty 1

## 2021-12-23 MED ORDER — IPRATROPIUM-ALBUTEROL 0.5-2.5 (3) MG/3ML IN SOLN
3.0000 mL | Freq: Four times a day (QID) | RESPIRATORY_TRACT | Status: DC
  Administered 2021-12-23 – 2021-12-24 (×4): 3 mL via RESPIRATORY_TRACT
  Filled 2021-12-23 (×4): qty 3

## 2021-12-23 MED ORDER — ACETAMINOPHEN 650 MG RE SUPP: 650.0000 mg | Freq: Four times a day (QID) | RECTAL | Status: DC | PRN

## 2021-12-23 MED ORDER — NITROGLYCERIN 0.4 MG SL SUBL
0.4000 mg | SUBLINGUAL_TABLET | SUBLINGUAL | Status: DC | PRN
  Administered 2021-12-23: 0.4 mg via SUBLINGUAL
  Filled 2021-12-23: qty 1

## 2021-12-23 NOTE — H&P (Signed)
History and Physical    Patient: Nicole Simpson VOJ:[id number] DOB: 02/27/1966 DOA: 12/23/2021 DOS: the patient was seen and examined on 12/23/2021 PCP: Rondel Oh, NP  Patient coming from: Home  Chief Complaint:  Chief Complaint  Patient presents with   Chest Pain        HPI: Nicole Simpson is a 56 y.o. female with medical history significant of coronary artery dissection, asthma presents to the hospital with chest pain stabbing in nature 8 out of 10 in intensity radiating to the back and left arm.  She does have some shortness of breath and some nausea but no diaphoresis.  She does see stars when trying to move around.  In the ER two cardiac enzymes were negative. Review of Systems: Review of Systems  Constitutional:  Negative for chills, fever and malaise/fatigue.  HENT:  Negative for hearing loss.   Eyes:  Negative for blurred vision.  Respiratory:  Positive for shortness of breath.   Cardiovascular:  Positive for chest pain.  Gastrointestinal:  Positive for nausea. Negative for abdominal pain, constipation, diarrhea and vomiting.  Genitourinary:  Negative for dysuria.  Musculoskeletal:  Negative for myalgias.  Skin:  Negative for rash.  Neurological:  Negative for headaches.  Endo/Heme/Allergies:  Does not bruise/bleed easily.  Psychiatric/Behavioral:  Negative for depression.     Past Medical History:  Diagnosis Date   Anxiety    Asthma    Depression    GERD (gastroesophageal reflux disease)    Hyperlipidemia    Insomnia    Migraine    Spontaneous dissection of coronary artery 2022   Past Surgical History:  Procedure Laterality Date   CESAREAN SECTION     CYST REMOVAL HAND Right    Social History:  reports that she has never smoked. She has never used smokeless tobacco. She reports that she does not currently use alcohol. She reports that she does not use drugs.  Allergies  Allergen Reactions   Cafergot [Ergotamine-Caffeine]     Family  History  Problem Relation Age of Onset   Coronary artery disease Mother    Cirrhosis Father    Coronary artery disease Maternal Grandmother     Prior to Admission medications   Medication Sig Start Date End Date Taking? Authorizing Provider  ASMANEX, 120 METERED DOSES, 220 MCG/ACT inhaler Inhale 2 puffs into the lungs 2 (two) times daily. 11/03/21   [provider]  clonazePAM (KLONOPIN) 1 MG tablet Take 1 mg by mouth at bedtime as needed for anxiety. 09/07/21   [provider]  gabapentin (NEURONTIN) 100 MG capsule Take 100 mg by mouth at bedtime. 09/07/21 09/07/22  [provider]  Galcanezumab-gnlm (EMGALITY) 120 MG/ML SOAJ Inject into the skin. Inject 240 mg into the skin monthly. LOADING DOSE-FIRST MONTH ONLY 10/12/21   [provider]  nitroGLYCERIN (NITROSTAT) 0.4 MG SL tablet Place under the tongue. Place 1 tablet (0.4 mg total) under the tongue as directed for Chest pain May take up to 3 doses. 08/27/21 08/27/22  [provider]  SPIRIVA HANDIHALER 18 MCG inhalation capsule Place 1 capsule into inhaler and inhale daily. 10/21/21   [provider]  Ubrogepant 100 MG TABS Take by mouth. Take 100 mg by mouth as directed (Take 1 tab at migraine onset, ok to repeat after 2 hours. Maximum 200mg  per 24 hr period.) 10/12/21 10/12/22  [provider]    Physical Exam: Vitals:   12/23/21 0809 12/23/21 0820 12/23/21 0900 12/23/21 1030  BP:  121/70 121/75 122/64  Pulse:  96 75 82  Resp:  18 15 14   Temp:  98 F (36.7 C)    TempSrc:  Oral    SpO2:  100% 100% 99%  Weight: 47.6 kg     Height: 5' (1.524 m)      Physical Exam HENT:     Head: Normocephalic.     Mouth/Throat:     Pharynx: No oropharyngeal exudate.  Eyes:     General: Lids are normal.     Conjunctiva/sclera: Conjunctivae normal.  Cardiovascular:     Rate and Rhythm: Normal rate and regular rhythm.     Heart sounds: Normal heart sounds, S1 normal and S2 normal.   Pulmonary:     Breath sounds: No decreased breath sounds, wheezing, rhonchi or rales.  Abdominal:     Palpations: Abdomen is soft.     Tenderness: There is no abdominal tenderness.  Musculoskeletal:     Right lower leg: No swelling.     Left lower leg: No swelling.  Skin:    General: Skin is warm.     Findings: No rash.  Neurological:     Mental Status: She is alert and oriented to person, place, and time.     Data Reviewed: Troponin x 2 negative, potassium 3.1, chloride 0.63, hemoglobin 12.7, platelet count 226, glucose 104  Assessment and Plan: * Chest pain Patient has a history of coronary artery dissection.  2 troponins negative.  Will consult cardiology.  Continue aspirin and nitroglycerin as needed.  Will add on a D-dimer to labs to see if I need to do a CAT scan of the chest.  Patient did have a CT scan of the chest on 11/09/2021 that was negative for PE.  Hx of migraines On Emgality as outpatient.  Anxiety As needed clonazepam.  Hypokalemia Replace potassium orally and check a magnesium.  Asthma Currently no wheezing.  Continue Asmanex or equivalent.      Advance Care Planning:   Code Status: Full Code   Consults: Cardiology  Family Communication: Declined  Severity of Illness: The appropriate patient status for this patient is OBSERVATION. Observation status is judged to be reasonable and necessary in order to provide the required intensity of service to ensure the patient's safety. The patient's presenting symptoms, physical exam findings, and initial radiographic and laboratory data in the context of their medical condition is felt to place them at decreased risk for further clinical deterioration. Furthermore, it is anticipated that the patient will be medically stable for discharge from the hospital within 2 midnights of admission.   Author: 11/11/2021, MD 12/23/2021 11:16 AM  For on call review [email or web address].

## 2021-12-23 NOTE — Consult Note (Signed)
[facility] Consultation Note  Patient ID: Nicole Simpson, MRN: [id number], DOB/AGE: 58-Mar-1967 58 y.o. Admit date: 12/23/2021   Date of Consult: 12/23/2021 Primary Physician: Nita Sickle, NP Primary Cardiologist: [facility]  Chief Complaint:  Chief Complaint  Patient presents with   Chest Pain        Reason for Consult: HPI     Chest Pain    Additional comments:        Last edited by Arlyce Harman, RN on 12/23/2021  8:08 AM.       HPI: 56 y.o. 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  The patient claims that her chest discomfort last year was significant to the center of her chest left arm and left jaw for which was not well relieved by medication management.  She had a cardiac catheterization showing a minimal change in her distal left anterior descending artery otherwise normal coronary arteries.  Echocardiogram showed normal LV systolic function and no evidence of significant new concerns.  The patient did use medication management and did well at that time.  She has now had 2 hospitalizations for similar type issues although not as significant chest discomfort.  Recent hospitalization showed that the patient had a CT angiogram with no evidence of pulmonary embolism and no evidence of coronary artery calcifications.  EKG showed nonspecific ST changes as it did before with a normal troponin.  Another recent evaluation personally reviewed by myself include of the CT coronary angiogram which showed patent arteries with no evidence of calcification or cholesterol and no evidence of significant coronary concerns.  At this time she has a left chest discomfort which occurred this morning and is continuing to be relatively stable but intense in the same upper chest area without evidence of hypoxia or congestive  heart failure or EKG changes or elevation of troponin.  Home medications have been minimal at this time due to no evidence of coronary artery disease or other significant risk factors requiring additional medications.  The patient appears to be relatively comfortable but still has claiming to be chest discomfort. I have reviewed her chest x-ray coronary artery angiogram EKG laboratory work and other hospitalizations and catheterization from before.  Past Medical History:  Diagnosis Date   Anxiety    Asthma    Depression    GERD (gastroesophageal reflux disease)    Hyperlipidemia    Insomnia    Migraine    Spontaneous dissection of coronary artery 2022      Surgical History:  Past Surgical History:  Procedure Laterality Date   CESAREAN SECTION     CYST REMOVAL HAND Right      Home Meds: Prior to Admission medications   Medication Sig Start Date End Date Taking? Authorizing Provider  ASMANEX, 120 METERED DOSES, 220 MCG/ACT inhaler Inhale 2 puffs into the lungs 2 (two) times daily. 11/03/21   [provider]  clonazePAM (KLONOPIN) 1 MG tablet Take 1 mg by mouth at bedtime as needed for anxiety. 09/07/21   [provider]  gabapentin (NEURONTIN) 100 MG capsule Take 100 mg by mouth at bedtime. 09/07/21 09/07/22  [provider]  Galcanezumab-gnlm (EMGALITY) 120 MG/ML SOAJ Inject into the skin. Inject 240 mg into the skin monthly. LOADING DOSE-FIRST MONTH ONLY 10/12/21   [provider]  nitroGLYCERIN (NITROSTAT) 0.4 MG SL tablet  Place under the tongue. Place 1 tablet (0.4 mg total) under the tongue as directed for Chest pain May take up to 3 doses. 08/27/21 08/27/22  [provider]  SPIRIVA HANDIHALER 18 MCG inhalation capsule Place 1 capsule into inhaler and inhale daily. 10/21/21   [provider]  Ubrogepant 100 MG TABS Take by mouth. Take 100 mg by mouth as directed (Take 1 tab at migraine onset, ok to repeat after 2 hours. Maximum 200mg  per  24 hr period.) 10/12/21 10/12/22  [provider]    Inpatient Medications:   aspirin EC  81 mg Oral Daily   budesonide  2 mL Inhalation BID   enoxaparin (LOVENOX) injection  40 mg Subcutaneous Q24H   gabapentin  100 mg Oral QHS   ipratropium-albuterol  3 mL Nebulization Q6H     Allergies:  Allergies  Allergen Reactions   Cafergot [Ergotamine-Caffeine]     Social History   Socioeconomic History   Marital status: Single    Spouse name: Not on file   Number of children: Not on file   Years of education: Not on file   Highest education level: Not on file  Occupational History   Not on file  Tobacco Use   Smoking status: Never   Smokeless tobacco: Never  Substance and Sexual Activity   Alcohol use: Not Currently   Drug use: Never   Sexual activity: Not on file  Other Topics Concern   Not on file  Social History Narrative   Not on file   Social Determinants of Health   Financial Resource Strain: Not on file  Food Insecurity: Not on file  Transportation Needs: Not on file  Physical Activity: Not on file  Stress: Not on file  Social Connections: Not on file  Intimate Partner Violence: Not on file     Family History  Problem Relation Age of Onset   Coronary artery disease Mother    Cirrhosis Father    Coronary artery disease Maternal Grandmother      Review of Systems Positive for chest discomfort Negative for: General:  chills, fever, night sweats or weight changes.  Cardiovascular: PND orthopnea syncope dizziness  Dermatological skin lesions rashes Respiratory: Cough congestion Urologic: Frequent urination urination at night and hematuria Abdominal: negative for nausea, vomiting, diarrhea, bright red blood per rectum, melena, or hematemesis Neurologic: negative for visual changes, and/or hearing changes  All other systems reviewed and are otherwise negative except as noted above.  Labs: No results for input(s): "CKTOTAL", "CKMB", "TROPONINI" in  the last 72 hours. Lab Results  Component Value Date   WBC 5.8 12/23/2021   HGB 12.7 12/23/2021   HCT 38.0 12/23/2021   MCV 84.6 12/23/2021   PLT 226 12/23/2021    Recent Labs  Lab 12/23/21 0825  NA 139  K 3.1*  CL 109  CO2 20*  BUN 16  CREATININE 0.63  CALCIUM 9.3  PROT 7.5  BILITOT 0.7  ALKPHOS 60  ALT 11  AST 18  GLUCOSE 104*   Lab Results  Component Value Date   CHOL 214 (H) 11/10/2021   HDL 88 11/10/2021   LDLCALC 115 (H) 11/10/2021   TRIG 53 11/10/2021   Lab Results  Component Value Date   DDIMER 0.54 (H) 11/09/2021    Radiology/Studies:  DG Chest Portable 1 View  Result Date: 12/23/2021 CLINICAL DATA:  Chest pain.  Shortness of breath. EXAM: PORTABLE CHEST 1 VIEW COMPARISON:  Chest x-ray 12/02/2021. FINDINGS: The heart size and mediastinal  contours are within normal limits. Both lungs are clear. The visualized skeletal structures are unremarkable. Scoliosis and multilevel degenerative change. IMPRESSION: No evidence of acute cardiopulmonary disease. Electronically Signed   By: Feliberto Harts M.D.   On: 12/23/2021 09:35    EKG: Normal sinus rhythm with nonspecific ST changes  Weights: Filed Weights   12/23/21 0809  Weight: 47.6 kg     Physical Exam: Blood pressure 122/64, pulse 82, temperature 98 F (36.7 C), temperature source Oral, resp. rate 14, height 5' (1.524 m), weight 47.6 kg, SpO2 99 %. Body mass index is 20.49 kg/m. General: Well developed, well nourished, in no acute distress. Head eyes ears nose throat: Normocephalic, atraumatic, sclera non-icteric, no xanthomas, nares are without discharge. No apparent thyromegaly and/or mass  Lungs: Normal respiratory effort.  no wheezes, no rales, no rhonchi.  Heart: RRR with normal S1 S2. no murmur gallop, no rub, PMI is normal size and placement, carotid upstroke normal without bruit, jugular venous pressure is normal Abdomen: Soft, non-tender, non-distended with normoactive bowel sounds. No  hepatomegaly. No rebound/guarding. No obvious abdominal masses. Abdominal aorta is normal size without bruit Extremities: No edema. no cyanosis, no clubbing, no ulcers  Peripheral : 2+ bilateral upper extremity pulses, 2+ bilateral femoral pulses, 2+ bilateral dorsal pedal pulse Neuro: Alert and oriented. No facial asymmetry. No focal deficit. Moves all extremities spontaneously. Musculoskeletal: Normal muscle tone without kyphosis Psych:  Responds to questions appropriately with a normal affect.    Assessment: 56 year old female with acute chest discomfort without evidence of acute coronary syndrome myocardial infarction EKG changes or elevation of troponin with a history of possible coronary artery spasm versus distal left anterior descending artery dissection needing further evaluation and treatment options  Plan: 1.  Continue observation for the next several hours and serial ECG enzymes and assessment as necessary 2.  No further cardiac diagnostics necessary at this time due to no evidence of acute coronary syndrome EKG changes or elevation of troponin 3.  Medication management for the possibility coronary artery spasm of distal left anterior descending artery or previous dissection including amlodipine and aspirin. 4.  Begin ambulation and follow-up for improvements of symptoms and adjustments of medication management  Signed, Lamar Blinks M.D. [facility] 12/23/2021, 12:24 PM

## 2021-12-23 NOTE — ED Notes (Signed)
Pt reports that she is here today for chest pain that has been ongoing x 1 month. Pt reports that the pain woke her from her sleep today. Pt reports that it felt like something stabbing her in the chest under her left breast and in her left upper chest. Pt reports that the pain radiated down her left arm and into her back. Pt states that she has had Shortness of breath, lightheadedness, and some nausea as well. Pt reports personal cardiac history, family history of MI, and personal history of high cholesterol.   Pt on cardiac monitor at this time. Pt is resting in bed in no acute distress.

## 2021-12-23 NOTE — ED Provider Notes (Signed)
[facility] Provider Note    Event Date/Time   First MD Initiated Contact with Patient 12/23/21 1054     (approximate)   History   Chest Pain (/)   HPI  Clea Dubach is a 56 y.o. female who comes in reporting progressive and worsening shortness of breath and chest pain.  Patient reports now that she gets chest pain after walking about 50 feet.  She is unable to walk further until she rest.  The last couple days she has has been awoken at night with chest pain and shortness of breath.  She is generally had not been having chest pain or shortness of breath if she was still.  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  She reported she was admitted about a week ago with positive D-dimer and was felt to have CHF.  She was seen by cardiology and told everything was okay and sent home..  She is back here now reporting her symptoms are worsening.      Physical Exam   Triage Vital Signs: ED Triage Vitals  Enc Vitals Group     BP 12/23/21 0820 121/70     Pulse Rate 12/23/21 0820 96     Resp 12/23/21 0820 18     Temp 12/23/21 0820 98 F (36.7 C)     Temp Source 12/23/21 0820 Oral     SpO2 12/23/21 0820 100 %     Weight 12/23/21 0809 104 lb 15 oz (47.6 kg)     Height 12/23/21 0809 5' (1.524 m)     Head Circumference --      Peak Flow --      Pain Score 12/23/21 0809 8     Pain Loc --      Pain Edu? --      Excl. in GC? --     Most recent vital signs: Vitals:   12/23/21 1030 12/23/21 1242  BP: 122/64 (!) 112/56  Pulse: 82 77  Resp: 14 16  Temp:  98.3 F (36.8 C)  SpO2: 99% 98%     General: Awake, no distress. CV:  Good peripheral perfusion.  Heart regular rate and rhythm no audible murmurs Resp:  Normal effort.  Lungs are clear Chest is nontender Abd:  No distention.  Bowel sounds are positive soft and nontender no organomegaly Extremities no  edema   ED Results / Procedures / Treatments   Labs (all labs ordered are listed, but only abnormal results are displayed) Labs Reviewed  COMPREHENSIVE METABOLIC PANEL - Abnormal; Notable for the following components:      Result Value   Potassium 3.1 (*)    CO2 20 (*)    Glucose, Bld 104 (*)    All other components within normal limits  D-DIMER, QUANTITATIVE - Abnormal; Notable for the following components:   D-Dimer, Quant 0.51 (*)    All other components within normal limits  CBC WITH DIFFERENTIAL/PLATELET  TROPONIN I (HIGH SENSITIVITY)  TROPONIN I (HIGH SENSITIVITY)     EKG  EKG read interpreted by me shows a normal sinus rhythm rate of 100 normal axis slight sagging in the ST segments some 1-2 F and V3 through 6.  This is similar to EKG from June but new from 2021.   RADIOLOGY Chest x-ray read by radiology reviewed and interpreted by me shows no acute disease  PROCEDURES:  Critical  Care performed:   Procedures   MEDICATIONS ORDERED IN ED: Medications  nitroGLYCERIN (NITROSTAT) SL tablet 0.4 mg (has no administration in time range)  clonazePAM (KLONOPIN) tablet 1 mg (has no administration in time range)  gabapentin (NEURONTIN) capsule 100 mg (has no administration in time range)  budesonide (PULMICORT) nebulizer solution 0.25 mg (0.25 mg Inhalation Given 12/23/21 1121)  enoxaparin (LOVENOX) injection 40 mg (has no administration in time range)  acetaminophen (TYLENOL) tablet 650 mg (has no administration in time range)    Or  acetaminophen (TYLENOL) suppository 650 mg (has no administration in time range)  ondansetron (ZOFRAN) tablet 4 mg (has no administration in time range)    Or  ondansetron (ZOFRAN) injection 4 mg (has no administration in time range)  aspirin EC tablet 81 mg (81 mg Oral Given 12/23/21 1121)  ipratropium-albuterol (DUONEB) 0.5-2.5 (3) MG/3ML nebulizer solution 3 mL (3 mLs Nebulization Given 12/23/21 1328)  amLODipine (NORVASC) tablet 2.5 mg  (2.5 mg Oral Not Given 12/23/21 1257)  iohexol (OMNIPAQUE) 350 MG/ML injection 75 mL (has no administration in time range)  potassium chloride SA (KLOR-CON M) CR tablet 40 mEq (40 mEq Oral Given 12/23/21 1121)     IMPRESSION / MDM / ASSESSMENT AND PLAN / ED COURSE  I reviewed the triage vital signs and the nursing notes.  Differential diagnosis includes, but is not limited to, crescendo angina.  Severe asthma could also do this.  Patient does not have pleuritic chest pain or shortness of breath without exertion.  D-dimer and CT angio reported negative recently so I do not think that that is the problem.  I would like to admit her and do a stress test on her if cardiology felt that would be safe..  This hopefully would elucidate any angina and/or asthma.  Patient's presentation is most consistent with acute presentation with potential threat to life or bodily function.  The patient is on the cardiac monitor to evaluate for evidence of arrhythmia and/or significant heart rate changes.  None have been seen    FINAL CLINICAL IMPRESSION(S) / ED DIAGNOSES   Final diagnoses:  Chest pain, unspecified type  Crescendo angina (HCC)  Dyspnea, unspecified type     Rx / DC Orders   ED Discharge Orders     None        Note:  This document was prepared using Dragon voice recognition software and may include unintentional dictation errors.   Arnaldo Natal, MD 12/23/21 [id number]

## 2021-12-23 NOTE — Assessment & Plan Note (Signed)
As needed clonazepam.

## 2021-12-23 NOTE — Assessment & Plan Note (Addendum)
Patient has a history of coronary artery dissection.  2 troponins negative.  Will consult cardiology.  Continue aspirin and nitroglycerin as needed.  Will add on a D-dimer to labs to see if I need to do a CAT scan of the chest.  Patient did have a CT scan of the chest on 11/09/2021 that was negative for PE.

## 2021-12-23 NOTE — ED Notes (Signed)
Pt up to ambulate to wheelchair in triage. Pt started leaning backwards and was c/o dizziness. Pt then corrected her fall but then started to fall again. This tech was able to grab pt and stabilize and assist to wheelchair.

## 2021-12-23 NOTE — ED Notes (Signed)
The pt was placed on a purwick due to the pt feeling dizzy while she ambulated to the toilet with assistance. The pt was assisted back to the bed without incident. Call bell at bedside. Pt resting and dizziness was subsiding.

## 2021-12-23 NOTE — Assessment & Plan Note (Addendum)
Currently no wheezing.  Continue Asmanex or equivalent.

## 2021-12-23 NOTE — Assessment & Plan Note (Signed)
On Emgality as outpatient.

## 2021-12-23 NOTE — Assessment & Plan Note (Signed)
Replace potassium orally and check a magnesium.

## 2021-12-23 NOTE — ED Provider Triage Note (Signed)
Emergency Medicine Provider Triage Evaluation Note  Nicole Simpson , a 56 y.o. female  was evaluated in triage.  Pt complains of chest pain this am.  History of spontaneous dissection of coronary artery per records 2022.   Review of Systems  Positive: SOB+  lightheaded. + nausea  mild edema lower extremities. Negative: No vomiting  Physical Exam  There were no vitals taken for this visit. Gen:   Awake, no distress   Resp:  Normal effort  MSK:   Moves extremities without difficulty  Other:    Medical Decision Making  Medically screening exam initiated at 8:08 AM.  Appropriate orders placed.  Estrella Deeds was informed that the remainder of the evaluation will be completed by another provider, this initial triage assessment does not replace that evaluation, and the importance of remaining in the ED until their evaluation is complete.     Tommi Rumps, PA-C 12/23/21 1004

## 2021-12-23 NOTE — ED Triage Notes (Signed)
Presents with left sided chest pain this am

## 2021-12-24 DIAGNOSIS — R079 Chest pain, unspecified: Secondary | ICD-10-CM

## 2021-12-24 LAB — CBC
HCT: 39.2 % (ref 36.0–46.0)
Hemoglobin: 12.8 g/dL (ref 12.0–15.0)
MCH: 28.1 pg (ref 26.0–34.0)
MCHC: 32.7 g/dL (ref 30.0–36.0)
MCV: 86.2 fL (ref 80.0–100.0)
Platelets: 217 10*3/uL (ref 150–400)
RBC: 4.55 MIL/uL (ref 3.87–5.11)
RDW: 13.5 % (ref 11.5–15.5)
WBC: 5 10*3/uL (ref 4.0–10.5)
nRBC: 0 % (ref 0.0–0.2)

## 2021-12-24 LAB — MAGNESIUM: Magnesium: 2.4 mg/dL (ref 1.7–2.4)

## 2021-12-24 LAB — LIPID PANEL
Cholesterol: 239 mg/dL — ABNORMAL HIGH (ref 0–200)
HDL: 96 mg/dL (ref >40)
LDL Cholesterol: 134 mg/dL — ABNORMAL HIGH (ref 0–99)
Total CHOL/HDL Ratio: 2.5 RATIO
Triglycerides: 47 mg/dL (ref <150)
VLDL: 9 mg/dL (ref 0–40)

## 2021-12-24 LAB — BASIC METABOLIC PANEL
Anion gap: 5 (ref 5–15)
BUN: 15 mg/dL (ref 6–20)
CO2: 24 mmol/L (ref 22–32)
Calcium: 9.4 mg/dL (ref 8.9–10.3)
Chloride: 110 mmol/L (ref 98–111)
Creatinine, Ser: 0.69 mg/dL (ref 0.44–1.00)
GFR, Estimated: >60 mL/min (ref >60)
Glucose, Bld: 92 mg/dL (ref 70–99)
Potassium: 3.9 mmol/L (ref 3.5–5.1)
Sodium: 139 mmol/L (ref 135–145)

## 2021-12-24 MED ORDER — AMLODIPINE BESYLATE 2.5 MG PO TABS: 1.2500 mg | ORAL_TABLET | Freq: Every day | ORAL | 1 refills | Status: AC

## 2021-12-24 MED ORDER — ASPIRIN 81 MG PO TBEC: 81.0000 mg | DELAYED_RELEASE_TABLET | Freq: Every day | ORAL | 12 refills | Status: AC

## 2021-12-24 MED ORDER — AMLODIPINE BESYLATE 2.5 MG PO TABS
1.2500 mg | ORAL_TABLET | Freq: Every day | ORAL | Status: DC
  Administered 2021-12-24: 1.25 mg via ORAL
  Filled 2021-12-24: qty 0.5

## 2021-12-24 MED ORDER — ROSUVASTATIN CALCIUM 20 MG PO TABS: 20.0000 mg | ORAL_TABLET | Freq: Every day | ORAL | 11 refills | Status: AC

## 2021-12-24 NOTE — Hospital Course (Addendum)
Taken from H&P.  Nicole Simpson is a 56 y.o. 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  Patient reports now that she gets chest pain after walking about 50 feet.  She is unable to walk further until she rest.  The last couple days she has has been awoken at night with chest pain and shortness of breath.  She is generally had not been having chest pain or shortness of breath if she was still.  Patient had a prior cardiac catheterization showing a minimal change in her distal left anterior descending artery otherwise normal coronary arteries. Echocardiogram showed normal LV systolic function and no evidence of significant new concerns. Recent CT coronary angiogram which showed patent arteries with no evidence of calcification or cholesterol and no evidence of significant coronary concerns.   Cardiology was consulted and they are not recommending any further cardiac diagnostic assessment due to no evidence of acute ACS on EKG or troponin. She might be having some coronary artery spasms and recommending amlodipine and aspirin.  8/11: Lipid panel with elevated total cholesterol at 239 and LDL of 134 with goal should be less than 70.  Rest of the labs remain unremarkable.  Troponin remain negative.  Barely positive D-dimer which seems the same like 1 month ago.  CTA was done which shows no PE or any other acute intrathoracic process.  Lower extremity venous Doppler was negative for any DVT.  She was noted to have a right popliteal fossa hypoechoic lesion measuring 2 x 0.8 cm which can represent an avascular mass versus Baker's cyst versus hematoma. She was also started on statin.  Patient is very anxious lady, had a huge pile of prior EKGs and very fixative on the EKGs listed as ischemic changes.  Tried  explaining multiple times the phenomena of coronary artery spasms but she keeps saying that we are not helping and giving her a good explanation.  No chest pain or shortness of breath.  Per patient chest pain and shortness of breath only occurs when she walks more than 50 feet.  Her troponin in the system was always negative, she had elevated troponin in February 2022 and believes that she is still have those elevated troponin and heart attack. Patient was started on aspirin, low-dose amlodipine and Crestor and need to have a close follow-up with her cardiologist. She will get benefit from psych evaluation due to her frequent visits to hospital for similar reason and a strong belief that something is wrong with her heart despite multiple reassurances.  Patient will continue her current medication and need to have a close follow-up with her providers.

## 2021-12-24 NOTE — Discharge Summary (Signed)
Physician Discharge Summary   Patient: Nicole Simpson MRN: [id number] DOB: 11/19/1965  Admit date:     12/23/2021  Discharge date: 12/24/21  Discharge Physician: Arnetha Courser   PCP: Rondel Oh, NP   Recommendations at discharge:  Follow-up with primary care provider and cardiology  Discharge Diagnoses: Principal Problem:   Chest pain Active Problems:   Asthma   Hypokalemia   Anxiety   Hx of migraines   Hospital Course: Taken from H&P.  Nicole Simpson is a 57 y.o. 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  Patient reports now that she gets chest pain after walking about 50 feet.  She is unable to walk further until she rest.  The last couple days she has has been awoken at night with chest pain and shortness of breath.  She is generally had not been having chest pain or shortness of breath if she was still.  Patient had a prior cardiac catheterization showing a minimal change in her distal left anterior descending artery otherwise normal coronary arteries. Echocardiogram showed normal LV systolic function and no evidence of significant new concerns. Recent CT coronary angiogram which showed patent arteries with no evidence of calcification or cholesterol and no evidence of significant coronary concerns.   Cardiology was consulted and they are not recommending any further cardiac diagnostic assessment due to no evidence of acute ACS on EKG or troponin. She might be having some coronary artery spasms and recommending amlodipine and aspirin.  8/11: Lipid panel with elevated total cholesterol at 239 and LDL of 134 with goal should be less than 70.  Rest of the labs remain unremarkable.  Troponin remain negative.  Barely positive D-dimer which seems the same like 1 month ago.   CTA was done which shows no PE or any other acute intrathoracic process.  Lower extremity venous Doppler was negative for any DVT.  She was noted to have a right popliteal fossa hypoechoic lesion measuring 2 x 0.8 cm which can represent an avascular mass versus Baker's cyst versus hematoma. She was also started on statin.  Patient is very anxious lady, had a huge pile of prior EKGs and very fixative on the EKGs listed as ischemic changes.  Tried explaining multiple times the phenomena of coronary artery spasms but she keeps saying that we are not helping and giving her a good explanation.  No chest pain or shortness of breath.  Per patient chest pain and shortness of breath only occurs when she walks more than 50 feet.  Her troponin in the system was always negative, she had elevated troponin in February 2022 and believes that she is still have those elevated troponin and heart attack. Patient was started on aspirin, low-dose amlodipine and Crestor and need to have a close follow-up with her cardiologist. She will get benefit from psych evaluation due to her frequent visits to hospital for similar reason and a strong belief that something is wrong with her heart despite multiple reassurances.  Patient will continue her current medication and need to have a close follow-up with her providers.  Assessment and Plan: * Chest pain Patient has a history of coronary artery dissection.  2 troponins negative.  Will consult cardiology.  Continue aspirin and nitroglycerin as needed.  Will add on a D-dimer to labs to see if I need  to do a CAT scan of the chest.  Patient did have a CT scan of the chest on 11/09/2021 that was negative for PE.  Hx of migraines On Emgality as outpatient.  Anxiety As needed clonazepam.  Hypokalemia Replace potassium orally and check a magnesium.  Asthma Currently no wheezing.  Continue Asmanex or equivalent.   Consultants: Cardiology Procedures performed: None Disposition:  Home Diet recommendation:  Discharge Diet Orders (From admission, onward)     Start     Ordered   12/24/21 0000  Diet - low sodium heart healthy        12/24/21 1117           Cardiac diet DISCHARGE MEDICATION: Allergies as of 12/24/2021       Reactions   Cafergot [ergotamine-caffeine]         Medication List     TAKE these medications    amLODipine 2.5 MG tablet Commonly known as: NORVASC Take 0.5 tablets (1.25 mg total) by mouth daily.   Asmanex (120 Metered Doses) 220 MCG/ACT inhaler Generic drug: mometasone Inhale 2 puffs into the lungs 2 (two) times daily.   aspirin EC 81 MG tablet Take 1 tablet (81 mg total) by mouth daily. Swallow whole.   clonazePAM 1 MG tablet Commonly known as: KLONOPIN Take 1 mg by mouth at bedtime as needed for anxiety.   Emgality 120 MG/ML Soaj Generic drug: Galcanezumab-gnlm Inject into the skin. Inject 240 mg into the skin monthly. LOADING DOSE-FIRST MONTH ONLY   gabapentin 100 MG capsule Commonly known as: NEURONTIN Take 100 mg by mouth at bedtime.   nitroGLYCERIN 0.4 MG SL tablet Commonly known as: NITROSTAT Place under the tongue. Place 1 tablet (0.4 mg total) under the tongue as directed for Chest pain May take up to 3 doses.   rosuvastatin 20 MG tablet Commonly known as: Crestor Take 1 tablet (20 mg total) by mouth daily.   Spiriva HandiHaler 18 MCG inhalation capsule Generic drug: tiotropium Place 1 capsule into inhaler and inhale daily.   Ubrogepant 100 MG Tabs Take by mouth. Take 100 mg by mouth as directed (Take 1 tab at migraine onset, ok to repeat after 2 hours. Maximum 200mg  per 24 hr period.)        Follow-up Information     , PA. Go in 1 week(s).   Specialty: Cardiology Contact information: [address] [place] [id number]         [phone], NP. Schedule an appointment as soon as possible for a visit in 1 week(s).   Specialty: Internal  Medicine Contact information: [address] [facility] [place] [address] [place] [phone]                Discharge Exam: Filed Weights   12/23/21 0809  Weight: 47.6 kg   General.     In no acute distress. Pulmonary.  Lungs clear bilaterally, normal respiratory effort. CV.  Regular rate and rhythm, no JVD, rub or murmur. Abdomen.  Soft, nontender, nondistended, BS positive. CNS.  Alert and oriented .  No focal neurologic deficit. Extremities.  No edema, no cyanosis, pulses intact and symmetrical. Psychiatry.  Judgment and insight appears normal.   Condition at discharge: stable  The results of significant diagnostics from this hospitalization (including imaging, microbiology, ancillary and laboratory) are listed below for reference.   Imaging Studies: 02/22/22 Venous Img Lower Bilateral (DVT)  Result Date: 12/23/2021 CLINICAL DATA:  02/22/2022.  Edema of the lower extremities.  EXAM: BILATERAL LOWER EXTREMITY VENOUS DOPPLER ULTRASOUND TECHNIQUE: Gray-scale sonography with graded compression, as well as color Doppler and duplex ultrasound were performed to evaluate the lower extremity deep venous systems from the level of the common femoral vein and including the common femoral, femoral, profunda femoral, popliteal and calf veins including the posterior tibial, peroneal and gastrocnemius veins when visible. The superficial great saphenous vein was also interrogated. Spectral Doppler was utilized to evaluate flow at rest and with distal augmentation maneuvers in the common femoral, femoral and popliteal veins. COMPARISON:  None Available. FINDINGS: RIGHT LOWER EXTREMITY Common Femoral Vein: No evidence of thrombus. Normal compressibility, respiratory phasicity and response to augmentation. Saphenofemoral Junction: No evidence of thrombus. Normal compressibility and flow on color Doppler imaging. Profunda Femoral Vein: No evidence of thrombus. Normal compressibility and flow on color  Doppler imaging. Femoral Vein: No evidence of thrombus. Normal compressibility, respiratory phasicity and response to augmentation. Popliteal Vein: No evidence of thrombus. Normal compressibility, respiratory phasicity and response to augmentation. Calf Veins: No evidence of thrombus. Normal compressibility and flow on color Doppler imaging. Superficial Great Saphenous Vein: No evidence of thrombus. Normal compressibility. Venous Reflux:  None. Other Findings:  None. LEFT LOWER EXTREMITY Common Femoral Vein: No evidence of thrombus. Normal compressibility, respiratory phasicity and response to augmentation. Saphenofemoral Junction: No evidence of thrombus. Normal compressibility and flow on color Doppler imaging. Profunda Femoral Vein: No evidence of thrombus. Normal compressibility and flow on color Doppler imaging. Femoral Vein: No evidence of thrombus. Normal compressibility, respiratory phasicity and response to augmentation. Popliteal Vein: No evidence of thrombus. Normal compressibility, respiratory phasicity and response to augmentation. Calf Veins: No evidence of thrombus. Normal compressibility and flow on color Doppler imaging. Superficial Great Saphenous Vein: No evidence of thrombus. Normal compressibility. Venous Reflux:  None. Other Findings: Right popliteal fossa avascular hypoechoic lesion measuring 2 x 0.8 cm. No definite increased through transmission to suggest cystic lesion. IMPRESSION: 1. No evidence of deep venous thrombosis in either lower extremity. 2. Right popliteal fossa avascular hypoechoic lesion measuring 2 x 0.8 cm with no definite increased through transmission to suggest definite cystic lesion. Finding could represent an avascular mass versus Baker's cyst versus hematoma. Electronically Signed   By: Tish Frederickson M.D.   On: 12/23/2021 16:38   CT Angio Chest Pulmonary Embolism (PE) W or WO Contrast  Result Date: 12/23/2021 CLINICAL DATA:  Left-sided chest pain, positive D dimer  EXAM: CT ANGIOGRAPHY CHEST WITH CONTRAST TECHNIQUE: Multidetector CT imaging of the chest was performed using the standard protocol during bolus administration of intravenous contrast. Multiplanar CT image reconstructions and MIPs were obtained to evaluate the vascular anatomy. RADIATION DOSE REDUCTION: This exam was performed according to the departmental dose-optimization program which includes automated exposure control, adjustment of the mA and/or kV according to patient size and/or use of iterative reconstruction technique. CONTRAST:  9mL OMNIPAQUE IOHEXOL 350 MG/ML SOLN COMPARISON:  11/09/2021, 12/23/2021 FINDINGS: Cardiovascular: This is a technically adequate evaluation of the pulmonary vasculature. No filling defects or pulmonary emboli. The heart is unremarkable without pericardial effusion. No evidence of thoracic aortic aneurysm or dissection. Mediastinum/Nodes: No enlarged mediastinal, hilar, or axillary lymph nodes. Thyroid gland, trachea, and esophagus demonstrate no significant findings. Lungs/Pleura: No acute airspace disease, effusion, or pneumothorax. Central airways are widely patent. Upper Abdomen: No acute abnormality. Hypodensities within the liver compatible with cysts. Musculoskeletal: No acute or destructive bony lesions. Reconstructed images demonstrate no additional findings. Review of the MIP images confirms the above findings. IMPRESSION: 1. No  evidence of pulmonary embolus. 2. No acute intrathoracic process. Electronically Signed   By: Sharlet Salina M.D.   On: 12/23/2021 15:38   DG Chest Portable 1 View  Result Date: 12/23/2021 CLINICAL DATA:  Chest pain.  Shortness of breath. EXAM: PORTABLE CHEST 1 VIEW COMPARISON:  Chest x-ray 12/02/2021. FINDINGS: The heart size and mediastinal contours are within normal limits. Both lungs are clear. The visualized skeletal structures are unremarkable. Scoliosis and multilevel degenerative change. IMPRESSION: No evidence of acute  cardiopulmonary disease. Electronically Signed   By: Feliberto Harts M.D.   On: 12/23/2021 09:35    Microbiology: No results found for this or any previous visit.  Labs: CBC: Recent Labs  Lab 12/23/21 0825 12/24/21 0607  WBC 5.8 5.0  NEUTROABS 2.8  --   HGB 12.7 12.8  HCT 38.0 39.2  MCV 84.6 86.2  PLT 226 217   Basic Metabolic Panel: Recent Labs  Lab 12/23/21 0825 12/24/21 0607  NA 139 139  K 3.1* 3.9  CL 109 110  CO2 20* 24  GLUCOSE 104* 92  BUN 16 15  CREATININE 0.63 0.69  CALCIUM 9.3 9.4  MG  --  2.4   Liver Function Tests: Recent Labs  Lab 12/23/21 0825  AST 18  ALT 11  ALKPHOS 60  BILITOT 0.7  PROT 7.5  ALBUMIN 4.3   CBG: No results for input(s): "GLUCAP" in the last 168 hours.  Discharge time spent: greater than 30 minutes.  This record has been created using Conservation officer, historic buildings. Errors have been sought and corrected,but may not always be located. Such creation errors do not reflect on the standard of care.   Signed: Arnetha Courser, MD [facility] 12/24/2021

## 2021-12-24 NOTE — Progress Notes (Signed)
[facility] Cardiology [facility] Encounter Note  Patient: Nicole Simpson / Admit Date: 12/23/2021 / Date of Encounter: 12/24/2021, 12:29 PM   Subjective: The patient had an episode of chest pain last night with no evidence of EKG changes.  I have reviewed outside and current EKGs which do show normal sinus rhythm with left ventricular hypertrophy and nonspecific ST changes but unchanged from previous hospitalizations.  Additionally the patient has had no evidence of troponin elevation.  I have personally reviewed the cardiac catheterization from last year showing very small area of coronary artery concern in the distal left anterior descending artery which can be medically managed.  LV function has been normal.  We have had a very long discussion about her concerns and issues and we have a current theory that she may have distal LAD spasm or other types of spasm which may be contributing.  Therefore it is imperative to try further medication management.  This includes very low-dose of amlodipine which should work.  In addition to that she does have some improvements with nitroglycerin as well  Review of Systems: Positive for: Chest pain Negative for: Vision change, hearing change, syncope, dizziness, nausea, vomiting,diarrhea, bloody stool, stomach pain, cough, congestion, diaphoresis, urinary frequency, urinary pain,skin lesions, skin rashes Others previously listed  Objective: Telemetry: Normal sinus rhythm Physical Exam: Blood pressure (!) 97/53, pulse 83, temperature 98.6 F (37 C), temperature source Oral, resp. rate 16, height 5' (1.524 m), weight 47.6 kg, SpO2 99 %. Body mass index is 20.49 kg/m. General: Well developed, well nourished, in no acute distress. Head: Normocephalic, atraumatic, sclera non-icteric, no xanthomas, nares are without discharge. Neck: No apparent masses Lungs: Normal respirations with no wheezes, no rhonchi, no rales , no crackles   Heart: Regular rate and  rhythm, normal S1 S2, no murmur, no rub, no gallop, PMI is normal size and placement, carotid upstroke normal without bruit, jugular venous pressure normal Abdomen: Soft, non-tender, non-distended with normoactive bowel sounds. No hepatosplenomegaly. Abdominal aorta is normal size without bruit Extremities: No edema, no clubbing, no cyanosis, no ulcers,  Peripheral: 2+ radial, 2+ femoral, 2+ dorsal pedal pulses Neuro: Alert and oriented. Moves all extremities spontaneously. Psych:  Responds to questions appropriately with a normal affect.   Intake/Output Summary (Last 24 hours) at 12/24/2021 1229 Last data filed at 12/24/2021 0900 Gross per 24 hour  Intake 120 ml  Output --  Net 120 ml    Inpatient Medications:   amLODipine  1.25 mg Oral Daily   aspirin EC  81 mg Oral Daily   budesonide  2 mL Inhalation BID   enoxaparin (LOVENOX) injection  40 mg Subcutaneous Q24H   gabapentin  100 mg Oral QHS   ipratropium-albuterol  3 mL Nebulization Q6H   Infusions:   Labs: Recent Labs    12/23/21 0825 12/24/21 0607  NA 139 139  K 3.1* 3.9  CL 109 110  CO2 20* 24  GLUCOSE 104* 92  BUN 16 15  CREATININE 0.63 0.69  CALCIUM 9.3 9.4  MG  --  2.4   Recent Labs    12/23/21 0825  AST 18  ALT 11  ALKPHOS 60  BILITOT 0.7  PROT 7.5  ALBUMIN 4.3   Recent Labs    12/23/21 0825 12/24/21 0607  WBC 5.8 5.0  NEUTROABS 2.8  --   HGB 12.7 12.8  HCT 38.0 39.2  MCV 84.6 86.2  PLT 226 217   No results for input(s): "CKTOTAL", "CKMB", "TROPONINI" in the  last 72 hours. Invalid input(s): "POCBNP" No results for input(s): "HGBA1C" in the last 72 hours.   Weights: Filed Weights   12/23/21 0809  Weight: 47.6 kg     Radiology/Studies:  US Venous Img Lower Bilateral (DVT)  Result Date: 12/23/2021 CLINICAL DATA:  78588.  Edema of the lower extremities. EXAM: BILATERAL LOWER EXTREMITY VENOUS DOPPLER ULTRASOUND TECHNIQUE: Gray-scale sonography with graded compression, as well as color  Doppler and duplex ultrasound were performed to evaluate the lower extremity deep venous systems from the level of the common femoral vein and including the common femoral, femoral, profunda femoral, popliteal and calf veins including the posterior tibial, peroneal and gastrocnemius veins when visible. The superficial great saphenous vein was also interrogated. Spectral Doppler was utilized to evaluate flow at rest and with distal augmentation maneuvers in the common femoral, femoral and popliteal veins. COMPARISON:  None Available. FINDINGS: RIGHT LOWER EXTREMITY Common Femoral Vein: No evidence of thrombus. Normal compressibility, respiratory phasicity and response to augmentation. Saphenofemoral Junction: No evidence of thrombus. Normal compressibility and flow on color Doppler imaging. Profunda Femoral Vein: No evidence of thrombus. Normal compressibility and flow on color Doppler imaging. Femoral Vein: No evidence of thrombus. Normal compressibility, respiratory phasicity and response to augmentation. Popliteal Vein: No evidence of thrombus. Normal compressibility, respiratory phasicity and response to augmentation. Calf Veins: No evidence of thrombus. Normal compressibility and flow on color Doppler imaging. Superficial Great Saphenous Vein: No evidence of thrombus. Normal compressibility. Venous Reflux:  None. Other Findings:  None. LEFT LOWER EXTREMITY Common Femoral Vein: No evidence of thrombus. Normal compressibility, respiratory phasicity and response to augmentation. Saphenofemoral Junction: No evidence of thrombus. Normal compressibility and flow on color Doppler imaging. Profunda Femoral Vein: No evidence of thrombus. Normal compressibility and flow on color Doppler imaging. Femoral Vein: No evidence of thrombus. Normal compressibility, respiratory phasicity and response to augmentation. Popliteal Vein: No evidence of thrombus. Normal compressibility, respiratory phasicity and response to  augmentation. Calf Veins: No evidence of thrombus. Normal compressibility and flow on color Doppler imaging. Superficial Great Saphenous Vein: No evidence of thrombus. Normal compressibility. Venous Reflux:  None. Other Findings: Right popliteal fossa avascular hypoechoic lesion measuring 2 x 0.8 cm. No definite increased through transmission to suggest cystic lesion. IMPRESSION: 1. No evidence of deep venous thrombosis in either lower extremity. 2. Right popliteal fossa avascular hypoechoic lesion measuring 2 x 0.8 cm with no definite increased through transmission to suggest definite cystic lesion. Finding could represent an avascular mass versus Baker's cyst versus hematoma. Electronically Signed   By: Tish Frederickson M.D.   On: 12/23/2021 16:38   CT Angio Chest Pulmonary Embolism (PE) W or WO Contrast  Result Date: 12/23/2021 CLINICAL DATA:  Left-sided chest pain, positive D dimer EXAM: CT ANGIOGRAPHY CHEST WITH CONTRAST TECHNIQUE: Multidetector CT imaging of the chest was performed using the standard protocol during bolus administration of intravenous contrast. Multiplanar CT image reconstructions and MIPs were obtained to evaluate the vascular anatomy. RADIATION DOSE REDUCTION: This exam was performed according to the departmental dose-optimization program which includes automated exposure control, adjustment of the mA and/or kV according to patient size and/or use of iterative reconstruction technique. CONTRAST:  58mL OMNIPAQUE IOHEXOL 350 MG/ML SOLN COMPARISON:  11/09/2021, 12/23/2021 FINDINGS: Cardiovascular: This is a technically adequate evaluation of the pulmonary vasculature. No filling defects or pulmonary emboli. The heart is unremarkable without pericardial effusion. No evidence of thoracic aortic aneurysm or dissection. Mediastinum/Nodes: No enlarged mediastinal, hilar, or axillary lymph nodes. Thyroid gland, trachea, and  esophagus demonstrate no significant findings. Lungs/Pleura: No acute  airspace disease, effusion, or pneumothorax. Central airways are widely patent. Upper Abdomen: No acute abnormality. Hypodensities within the liver compatible with cysts. Musculoskeletal: No acute or destructive bony lesions. Reconstructed images demonstrate no additional findings. Review of the MIP images confirms the above findings. IMPRESSION: 1. No evidence of pulmonary embolus. 2. No acute intrathoracic process. Electronically Signed   By: Sharlet Salina M.D.   On: 12/23/2021 15:38   DG Chest Portable 1 View  Result Date: 12/23/2021 CLINICAL DATA:  Chest pain.  Shortness of breath. EXAM: PORTABLE CHEST 1 VIEW COMPARISON:  Chest x-ray 12/02/2021. FINDINGS: The heart size and mediastinal contours are within normal limits. Both lungs are clear. The visualized skeletal structures are unremarkable. Scoliosis and multilevel degenerative change. IMPRESSION: No evidence of acute cardiopulmonary disease. Electronically Signed   By: Feliberto Harts M.D.   On: 12/23/2021 09:35     Assessment and Recommendation  56 y.o. female with intermittent episodes of chest discomfort or left arm discomfort of unknown etiology but possibly due to distal LAD spasm which should be well-controlled with calcium channel blocker.  There is no current evidence of heart failure or acute coronary syndrome 1.  Continue amlodipine 1.25 mg once or twice per day depending on the need in the future but working toward goal dose of amlodipine 2.5 mg. 2.  No other diagnostic testing at this time due to no evidence of congestive heart failure or acute coronary syndrome 3.  No need for high intensity cholesterol therapy due to no apparent coronary artery disease 4.  If patient ambulating well would be okay for discharge to home  Signed, Arnoldo Hooker M.D. FACC

## 2021-12-24 NOTE — Progress Notes (Signed)
  Transition of Care [facility]) Screening Note   Patient Details  Name: Nicole Simpson Date of Birth: 01/01/1966   Transition of Care [facility]) CM/SW Contact:    Gildardo Griffes, LCSW Phone Number: 12/24/2021, 11:31 AM    Transition of Care Department [facility]) has reviewed patient and no TOC needs have been identified at this time. We will continue to monitor patient advancement through interdisciplinary progression rounds. If new patient transition needs arise, please place a TOC consult.  [place], [place] [phone]

## 2023-03-15 ENCOUNTER — Telehealth: Payer: Self-pay | Admitting: Diagnostic Neuroimaging

## 2023-03-15 NOTE — Telephone Encounter (Signed)
Received sleep and neuro referral for this patient on 12/13/22. Sleep copy was given to [facility] for review and pt was told to expect a separate call to discuss sleep. Sleep referral not entered into Epic unsure what was done with this but patient called in wanting to schedule as she thinks part of the problem with her migraines is that they are stemming from sleep issues. I let her know Zane Herald is out of the office today, but to expect a call back regarding this as soon as possible. Unsure if referral was declined or lost?

## 2023-03-16 NOTE — Telephone Encounter (Signed)
Patient sleep referral was declined by Dr. Frances Furbish on 12/13/22 due to pt being est with sleep providers already, Dr. Frances Furbish recommended patient continue care with current providers. Will relay this to patient with phone call to patient.

## 2023-03-25 ENCOUNTER — Encounter: Payer: Self-pay | Admitting: Diagnostic Neuroimaging

## 2023-03-28 ENCOUNTER — Ambulatory Visit: Payer: Medicare HMO | Admitting: Diagnostic Neuroimaging
